# Patient Record
Sex: Female | Born: 2000 | Race: Black or African American | Hispanic: No | Marital: Single | Smoking: Never smoker
Health system: Southern US, Community
[De-identification: ages and names within clinical notes are randomized; demographics above are authoritative.]

## PROBLEM LIST (undated history)

## (undated) DIAGNOSIS — N3010 Interstitial cystitis (chronic) without hematuria: Secondary | ICD-10-CM

## (undated) DIAGNOSIS — R011 Cardiac murmur, unspecified: Secondary | ICD-10-CM

## (undated) DIAGNOSIS — D649 Anemia, unspecified: Secondary | ICD-10-CM

## (undated) DIAGNOSIS — N739 Female pelvic inflammatory disease, unspecified: Secondary | ICD-10-CM

## (undated) DIAGNOSIS — M797 Fibromyalgia: Secondary | ICD-10-CM

## (undated) HISTORY — DX: Fibromyalgia: M79.7

## (undated) HISTORY — DX: Anemia, unspecified: D64.9

## (undated) HISTORY — DX: Interstitial cystitis (chronic) without hematuria: N30.10

---
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
## 2022-01-17 ENCOUNTER — Other Ambulatory Visit: Payer: Self-pay

## 2022-01-19 ENCOUNTER — Ambulatory Visit (INDEPENDENT_AMBULATORY_CARE_PROVIDER_SITE_OTHER): Payer: Medicaid Other | Admitting: Nurse Practitioner

## 2022-01-19 ENCOUNTER — Other Ambulatory Visit (INDEPENDENT_AMBULATORY_CARE_PROVIDER_SITE_OTHER): Payer: Medicaid Other

## 2022-01-19 ENCOUNTER — Encounter: Payer: Self-pay | Admitting: Nurse Practitioner

## 2022-01-19 VITALS — BP 100/60 | HR 95 | Ht 67.0 in | Wt 228.0 lb

## 2022-01-19 DIAGNOSIS — K5900 Constipation, unspecified: Secondary | ICD-10-CM

## 2022-01-19 DIAGNOSIS — R1032 Left lower quadrant pain: Secondary | ICD-10-CM

## 2022-01-19 LAB — HIGH SENSITIVITY CRP: CRP, High Sensitivity: 6.14 mg/L — ABNORMAL HIGH (ref 0.000–5.000)

## 2022-01-19 LAB — CBC WITH DIFFERENTIAL/PLATELET
Basophils Absolute: 0 10*3/uL (ref 0.0–0.1)
Basophils Relative: 0.5 % (ref 0.0–3.0)
Eosinophils Absolute: 0.2 10*3/uL (ref 0.0–0.7)
Eosinophils Relative: 3 % (ref 0.0–5.0)
HCT: 38.3 % (ref 36.0–49.0)
Hemoglobin: 12.7 g/dL (ref 12.0–16.0)
Lymphocytes Relative: 35.6 % (ref 24.0–48.0)
Lymphs Abs: 2.1 10*3/uL (ref 0.7–4.0)
MCHC: 33 g/dL (ref 31.0–37.0)
MCV: 88.2 fl (ref 78.0–98.0)
Monocytes Absolute: 0.6 10*3/uL (ref 0.1–1.0)
Monocytes Relative: 9.4 % (ref 3.0–12.0)
Neutro Abs: 3 10*3/uL (ref 1.4–7.7)
Neutrophils Relative %: 51.5 % (ref 43.0–71.0)
Platelets: 280 10*3/uL (ref 150.0–575.0)
RBC: 4.35 Mil/uL (ref 3.80–5.70)
RDW: 13.9 % (ref 11.4–15.5)
WBC: 5.9 10*3/uL (ref 4.5–13.5)

## 2022-01-19 LAB — COMPREHENSIVE METABOLIC PANEL
ALT: 9 U/L (ref 0–35)
AST: 15 U/L (ref 0–37)
Albumin: 4.5 g/dL (ref 3.5–5.2)
Alkaline Phosphatase: 58 U/L (ref 47–119)
BUN: 11 mg/dL (ref 6–23)
CO2: 27 mEq/L (ref 19–32)
Calcium: 9.4 mg/dL (ref 8.4–10.5)
Chloride: 102 mEq/L (ref 96–112)
Creatinine, Ser: 0.8 mg/dL (ref 0.40–1.20)
GFR: 107.4 mL/min (ref >60.00)
Glucose, Bld: 75 mg/dL (ref 70–99)
Potassium: 4.1 mEq/L (ref 3.5–5.1)
Sodium: 139 mEq/L (ref 135–145)
Total Bilirubin: 0.3 mg/dL (ref 0.3–1.2)
Total Protein: 7.7 g/dL (ref 6.0–8.3)

## 2022-01-19 LAB — SEDIMENTATION RATE: Sed Rate: 38 mm/hr — ABNORMAL HIGH (ref 0–20)

## 2022-01-19 MED ORDER — DICYCLOMINE HCL 10 MG PO CAPS: 10.0000 mg | ORAL_CAPSULE | Freq: Three times a day (TID) | ORAL | 0 refills | Status: DC | PRN

## 2022-01-19 NOTE — Progress Notes (Signed)
? ? ? ?01/19/2022 ?Jochebed Bills ?[id number] ?[birth year] ? ? ?CHIEF COMPLAINT: Stomach pain  ? ?HISTORY OF PRESENT ILLNESS: Norma Wright is an 21 year old female with a past medical history of acne and chronic LLQ pain.  No past surgical history.  She presents to our office today for further evaluation regarding "stomach pain". She points to her LLQ when referring to having "stomach pain". No epigastric or LUQ pain. She endorses having a history of LLQ pain which started at the age of 54, went away when she was 55 then recurred 10/2021.  She went to the hospital ED x 2 in 2013 due to her LLQ pain, etiology for her abdominal pain was not identified.  She complains of having LLQ pain which comes and goes but occurs most days.  She passes a normal formed brown bowel movement twice weekly.  No fiber or laxative use.  She passed a small amount of bright red blood with her bowel movements for 1 week 10/2021 which resolved until 12/2021 when she saw a small amount of blood on her stools x 2.  No associated anorectal pain.  No mucus per the rectum.  She is lactose intolerant.  Sugary foods gives her the sensation of feeling hot and triggers her LLQ pain.  No known family history of IBD or colorectal cancer.  Her weight fluctuates up and down a bit, no significant weight loss.  She started taking Accutane for acne 08/2021.  She infrequently takes Advil.  She complains of having chronic joint pain specifically to her hands, elbows and knees. ? ?She presented to the ED 12/11/2021 with chest and abdominal pain. She also noted having a sore throat, headaches, body aches and nausea for few days.  EKG showed sinus tachycardia.  X-ray is negative.  WBC 11.2.  Hemoglobin 12.3.  BMP was normal.  She was thought to likely have a viral illness and she was discharged home. ? ? ?  Latest Ref Rng & Units 12/11/2021  ?  6:51 PM 09/25/2018  ?  3:04 PM 08/23/2017  ? 10:39 AM  ?CBC  ?WBC 4.0 - 10.5 K/uL 11.2    6.5    ?Hemoglobin 12.0 - 15.0 g/dL 37.8    58.8   50.2    ?Hematocrit 36.0 - 46.0 % 38.3    39.6    ?Platelets 150 - 400 K/uL 309    288    ?  ? ? ? ?  Latest Ref Rng & Units 12/11/2021  ?  6:51 PM 08/23/2017  ? 10:39 AM  ?CMP  ?Glucose 70 - 99 mg/dL 95   89    ?BUN 6 - 20 mg/dL 8   14    ?Creatinine 0.44 - 1.00 mg/dL 7.74   1.28    ?Sodium 135 - 145 mmol/L 137   139    ?Potassium 3.5 - 5.1 mmol/L 3.9   4.3    ?Chloride 98 - 111 mmol/L 102   103    ?CO2 22 - 32 mmol/L 26   29    ?Calcium 8.9 - 10.3 mg/dL 9.7   78.6    ?Total Protein 6.3 - 8.2 g/dL  7.4    ?Total Bilirubin 0.2 - 1.1 mg/dL  0.3    ?AST 12 - 32 U/L  13    ?ALT 6 - 19 U/L  7    ? ?Social History: She is single.  She works in Engineering geologist.  Non-smoker.  No alcohol use.  No drug use. ? ?  Family History:  ? ? reports that she has never smoked. She has never used smokeless tobacco. She reports that she does not drink alcohol and does not use drugs. ?family history includes Asthma in her maternal uncle; Kidney disease in her maternal grandfather. ? ?No Known Allergies ? ?  ?Outpatient Encounter Medications as of 01/19/2022  ?Medication Sig  ? Multiple Vitamin (MULTIVITAMIN) tablet Take 1 tablet by mouth daily.  ? Probiotic Product (PROBIOTIC-10 PO) Take by mouth.  ? ?No facility-administered encounter medications on file as of 01/19/2022.  ? ? ?REVIEW OF SYSTEMS:  ?Gen: Denies fever, sweats or chills. No weight loss.  ?CV: Denies chest pain, palpitations or edema. ?Resp: Denies cough, shortness of breath of hemoptysis.  ?GI: See HPI.   ?GU : Denies urinary burning, blood in urine, increased urinary frequency or incontinence. ?MS: Denies joint pain, muscles aches or weakness. ?Derm: Denies rash, itchiness, skin lesions or unhealing ulcers. ?Psych: Denies depression, anxiety or memory loss. ?Heme: Denies bruising, easy bleeding. ?Neuro:  Denies headaches, dizziness or paresthesias. ?Endo:  Denies any problems with DM, thyroid or adrenal function. ? ?PHYSICAL EXAM: ?Ht 5\' 7"  (1.702 m)   Wt 228 lb (103.4 kg)    BMI 35.71 kg/m?  ? ?General: 21 year old female in no acute distress. ?Head: Normocephalic and atraumatic. ?Eyes:  Sclerae non-icteric, conjunctive pink. ?Ears: Normal auditory acuity. ?Mouth: Dentition intact. No ulcers or lesions.  ?Neck: Supple, no lymphadenopathy or thyromegaly.  ?Lungs: Clear bilaterally to auscultation without wheezes, crackles or rhonchi. ?Heart: Regular rate and rhythm. No murmur, rub or gallop appreciated.  ?Abdomen: Soft, non distended.  Very mild LLQ tenderness without rebound or guarding.  No masses. No hepatosplenomegaly. Normoactive bowel sounds x 4 quadrants.  ?Rectal: Deferred. ?Musculoskeletal: Symmetrical with no gross deformities. ?Skin: Warm and dry. No rash or lesions on visible extremities. ?Extremities: No edema. ?Neurological: Alert oriented x 4, no focal deficits.  ?Psychological:  Alert and cooperative. Normal mood and affect. ? ?ASSESSMENT AND PLAN: ? ?71) 21 year old female with recurrent chronic LLQ pain with associated bloody stools x 7 days 10/2021 and x 2 days 12/2021 ?-CBC, CMP, CRP, TTG and IgA ?-MiraLAX nightly to increase stool output ?-I added Benefiber 1 tablespoon daily in 1 week as tolerated ?-Patient will follow-up in the office in 4 to 6 weeks, if her LLQ pain persists to further discuss scheduling a CTAP and diagnostic colonoscopy ?-Patient to contact office if bloody stools recur ? ?2) Chronic constipation, passes a BM twice weekly ?-See plan in # 1 ? ? ? ?CC:  No ref. provider found ? ? ? ?

## 2022-01-19 NOTE — Patient Instructions (Addendum)
Your provider has requested that you go to the basement level for lab work before leaving today. Press "B" on the elevator. The lab is located at the first door on the left as you exit the elevator. ? ?Please purchase the following medications over the counter and take as directed: ?Miralax 17 grams (1 capful) dissolved in at least 8 ounces water/juice  as tolerated to increase stool output. ? ?Benefiber 1 tablespoon once daily dissolved in at least 8 ounces water/juice daily (please start this 1 week prior to starting Miralax). ? ?We have sent the following medications to your pharmacy for you to pick up at your convenience: ?Dicyclomine 10 mg three times daily as needed for abdominal pain ? ?Please follow up with Alcide Evener, NP on 03/08/22 at 10:00 am. ? ?If you are age 82 or older, your body mass index should be between 23-30. Your Body mass index is 35.71 kg/m?Marland Kitchen If this is out of the aforementioned range listed, please consider follow up with your Primary Care Provider. ? ?If you are age 35 or younger, your body mass index should be between 19-25. Your Body mass index is 35.71 kg/m?Marland Kitchen If this is out of the aformentioned range listed, please consider follow up with your Primary Care Provider.  ? ?________________________________________________________ ? ?The [facility] providers would like to encourage you to use [facility] to communicate with providers for non-urgent requests or questions.  Due to long hold times on the telephone, sending your provider a message by [facility] may be a faster and more efficient way to get a response.  Please allow 48 business hours for a response.  Please remember that this is for non-urgent requests.  ?_______________________________________________________ ? ?Due to recent changes in healthcare laws, you may see the results of your imaging and laboratory studies on MyChart before your provider has had a chance to review them.  We understand that in some cases there may be  results that are confusing or concerning to you. Not all laboratory results come back in the same time frame and the provider may be waiting for multiple results in order to interpret others.  Please give [place] 48 hours in order for your provider to thoroughly review all the results before contacting the office for clarification of your results.  ? ?

## 2022-01-22 ENCOUNTER — Telehealth: Payer: Self-pay | Admitting: Nurse Practitioner

## 2022-01-22 LAB — TISSUE TRANSGLUTAMINASE, IGA: (tTG) Ab, IgA: <1 U/mL

## 2022-01-22 LAB — IGA: Immunoglobulin A: 157 mg/dL (ref 47–310)

## 2022-01-22 NOTE — Telephone Encounter (Signed)
Inbound call from patient requesting a call back to go over lab results from 5/5. Please advise.  ?

## 2022-01-23 NOTE — Telephone Encounter (Signed)
See lab notes

## 2022-02-08 NOTE — Progress Notes (Signed)
Reviewed and agree with documentation and assessment and plan. K. Veena Laylonie Marzec , MD   

## 2022-02-19 ENCOUNTER — Other Ambulatory Visit: Payer: Self-pay | Admitting: Nurse Practitioner

## 2022-03-07 NOTE — Progress Notes (Signed)
03/08/2022 Norma Wright [id number] [birth year]   Chief Complaint: Constipation, abdominal pain follow-up  History of Present Illness:  Norma Wright is an 21 year old female with a past medical history of acne and chronic LLQ pain.  No past surgical history. I initially saw Norma Wright in office on 01/19/2022 for further evaluation regarding LLQ pain and constipation.  At that time, I recommended for her to take Benefiber in the morning and MiraLAX nightly.  She stated within 1 week of taking Benefiber she started passing normal formed brown bowel movement daily and her LLQ pain abated.  She took Miralax x 1 dose. She stopped taking Benefiber as her symptoms abated which has resulted in passing a BM every other day. No further rectal bleeding.  Labs 01/19/2022 showed a hemoglobin level of 12.7.  Hematocrit 38.3.  WBC 5.9.  CMP was normal.  TTG and IgA levels were within normal limits.  Sed rate was elevated at 38 and CRP 6.140.  She has increased the fiber in her diet.  She complains of bilateral elbow and knee pain which started 3 to 4 months ago.  She reported taking Ferrous sulfate and B12 supplement for many years she stopped taking of 3 to 4 months ago.  She has menorrhagia, reports heavy menstrual bleeding 3 to 4 days monthly.  Current Outpatient Medications on File Prior to Visit  Medication Sig Dispense Refill   cyanocobalamin 1000 MCG tablet Take 1 tablet by mouth daily.     dicyclomine (BENTYL) 10 MG capsule TAKE 1 CAPSULE (10 MG TOTAL) BY MOUTH 3 TIMES A DAY AS NEEDED FOR ABDOMINAL PAIN 90 capsule 0   ferrous sulfate 325 (65 FE) MG EC tablet Take by mouth.     Probiotic Product (PROBIOTIC-10 PO) Take by mouth.     No current facility-administered medications on file prior to visit.   No Known Allergies  Current Medications, Allergies, Past Medical History, Past Surgical History, Family History and Social History were reviewed in [employer] record.  Review  of Systems:   Constitutional: Negative for fever, sweats, chills or weight loss.  Respiratory: Negative for shortness of breath.   Cardiovascular: Negative for chest pain, palpitations and leg swelling.  Gastrointestinal: See HPI.  Musculoskeletal:+ Knee and elbow pain.  Neurological: Negative for dizziness, headaches or paresthesias.   Physical Exam: BP 118/80   Pulse 79   Ht 5\' 7"  (1.702 m)   Wt 222 lb (100.7 kg)   BMI 34.77 kg/m   General: 21 year old female in no acute distress Head: Normocephalic and atraumatic. Eyes: No scleral icterus. Conjunctiva pink . Ears: Normal auditory acuity. Mouth: Dentition intact. No ulcers or lesions.  Lungs: Clear throughout to auscultation. Heart: Regular rate and rhythm, no murmur. Abdomen: Soft, nontender and nondistended. No masses or hepatomegaly. Normal bowel sounds x 4 quadrants.  Rectal: Deferred Musculoskeletal: Symmetrical with no gross deformities. Extremities: No edema. Neurological: Alert oriented x 4. No focal deficits.  Psychological: Alert and cooperative. Normal mood and affect  Assessment and Recommendations:  12) 21 year old female with LLQ pain, resolved one week after taking Benefiber daily. No further rectal bleeding. Elevated CRP and sed rate levels.  Normal TTG and IgA levels. -Benefiber 1 tablespoon daily -Drink 6 to 8 glasses of water daily -MiraLAX nightly as needed -Patient will contact her office if constipation, lower abdominal pain or rectal bleeding recurs.  I discussed scheduling a diagnostic colonoscopy ro rule out IBD/colitis if her symptoms recur -Follow up  as needed    2) Chronic constipation, resolved after taking Benefiber x 1 week and Miralax x 1 dose  -See plan in # 1  3) Bilateral elbow and knee pain. Elevated CRP and sed rate.  -Rheumatology referral   4) History of menorrhagia  -Patient advised to obtain a PCP, follow up with gyn

## 2022-03-08 ENCOUNTER — Encounter: Payer: Self-pay | Admitting: Nurse Practitioner

## 2022-03-08 ENCOUNTER — Ambulatory Visit (INDEPENDENT_AMBULATORY_CARE_PROVIDER_SITE_OTHER): Payer: Medicaid Other | Admitting: Nurse Practitioner

## 2022-03-08 VITALS — BP 118/80 | HR 79 | Ht 67.0 in | Wt 222.0 lb

## 2022-03-08 DIAGNOSIS — R1032 Left lower quadrant pain: Secondary | ICD-10-CM

## 2022-03-08 DIAGNOSIS — K5900 Constipation, unspecified: Secondary | ICD-10-CM | POA: Diagnosis not present

## 2022-03-08 DIAGNOSIS — M2550 Pain in unspecified joint: Secondary | ICD-10-CM

## 2022-03-08 DIAGNOSIS — K581 Irritable bowel syndrome with constipation: Secondary | ICD-10-CM | POA: Insufficient documentation

## 2022-03-08 NOTE — Patient Instructions (Addendum)
1) TAKE BENEFIBER ONE TABLESPOON DAILY   2) TAKE MIRALAX AT BED TIME AS NEEDED, OK TO TAKE EVERY NIGHT AS NEEDED  3) CONTACT OUR OFFICE IF YOUR ABDOMINAL PAIN, CONSTIPATION OR RECTAL BLEEDING RECURS   We have referred you to Dr Corliss Skains for Rheumatology. Please let us know if you do not hear from her office within the next 2-3 weeks.  Please purchase the following medications over the counter and take as directed: Benefiber Miralax  Follow up as needed.  If you are age 68 or older, your body mass index should be between 23-30. Your Body mass index is 35.71 kg/m. If this is out of the aforementioned range listed, please consider follow up with your Primary Care Provider.  If you are age 24 or younger, your body mass index should be between 19-25. Your Body mass index is 35.71 kg/m. If this is out of the aformentioned range listed, please consider follow up with your Primary Care Provider.   ________________________________________________________  The [facility] providers would like to encourage you to use [facility] to communicate with providers for non-urgent requests or questions.  Due to long hold times on the telephone, sending your provider a message by [facility] may be a faster and more efficient way to get a response.  Please allow 48 business hours for a response.  Please remember that this is for non-urgent requests.  _______________________________________________________  Due to recent changes in healthcare laws, you may see the results of your imaging and laboratory studies on MyChart before your provider has had a chance to review them.  We understand that in some cases there may be results that are confusing or concerning to you. Not all laboratory results come back in the same time frame and the provider may be waiting for multiple results in order to interpret others.  Please give [place] 48 hours in order for your provider to thoroughly review all the results before contacting the  office for clarification of your results.

## 2022-04-24 ENCOUNTER — Ambulatory Visit
Admission: EM | Admit: 2022-04-24 | Discharge: 2022-04-24 | Disposition: A | Discharge status: Home / Self Care | Payer: Medicaid Other | Attending: Family Medicine | Admitting: Family Medicine

## 2022-04-24 DIAGNOSIS — M2550 Pain in unspecified joint: Secondary | ICD-10-CM

## 2022-04-24 MED ORDER — MELOXICAM 15 MG PO TABS: 15.0000 mg | ORAL_TABLET | Freq: Every day | ORAL | 0 refills | Status: DC

## 2022-04-24 MED ORDER — KETOROLAC TROMETHAMINE 30 MG/ML IJ SOLN
30.0000 mg | Freq: Once | INTRAMUSCULAR | Status: AC
Start: 2022-04-24 — End: 2022-04-24
  Administered 2022-04-24: 30 mg via INTRAMUSCULAR

## 2022-04-24 NOTE — ED Triage Notes (Signed)
The patient states she has been having bone pain and her PCP recommended she go see a rheumatologist but they are booked up. The patient states the pain is worse in her left arm at this time.

## 2022-04-24 NOTE — ED Provider Notes (Signed)
[facility]    CSN: [id number] Arrival date & time: 04/24/22  1734      History   Chief Complaint Chief Complaint  Patient presents with   Arm Pain    HPI Norma Wright is a 21 y.o. female.    Arm Pain   Here for bone pain and joint pain, going on for years.  He states her gastroenterologist did a CRP and sed rate that were elevated.  He told her she should probably see a rheumatologist, and she is got an appointment for late September.  She states she is in a lot of pain and so comes here today for assistance.  She does not have a PCP currently.  Ibuprofen is not helping  No recent fevers.  Her main pain lately is in her left arm and hand and her right hand.  Also she has some pain in her left leg.  No edema Last menstrual cycle was in mid July.  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 No pertinent surgical history.  OB History   No obstetric history on file.      Home Medications    Prior to Admission medications   Medication Sig Start Date End Date Taking? Authorizing Provider  meloxicam (MOBIC) 15 MG tablet Take 1 tablet (15 mg total) by mouth daily. 04/24/22  Yes Zenia Resides, MD  cyanocobalamin 1000 MCG tablet Take 1 tablet by mouth daily.    [provider]  dicyclomine (BENTYL) 10 MG capsule TAKE 1 CAPSULE (10 MG TOTAL) BY MOUTH 3 TIMES A DAY AS NEEDED FOR ABDOMINAL PAIN 02/20/22   Arnaldo Natal, NP  ferrous sulfate 325 (65 FE) MG EC tablet Take by mouth.    [provider]  Probiotic Product (PROBIOTIC-10 PO) Take by mouth.    [provider]    Family History Family History  Problem Relation Age of Onset   Asthma Maternal Uncle    Kidney disease Maternal Grandfather     Social  History Social History   Tobacco Use   Smoking status: Never   Smokeless tobacco: Never  Vaping Use   Vaping Use: Never used  Substance Use Topics   Alcohol use: No   Drug use: No     Allergies   Patient has no known allergies.   Review of Systems Review of Systems   Physical Exam Triage Vital Signs ED Triage Vitals  Enc Vitals Group     BP 04/24/22 1910 113/80     Pulse Rate 04/24/22 1910 81     Resp 04/24/22 1910 18     Temp 04/24/22 1910 98.5 F (36.9 C)     Temp Source 04/24/22 1910 Oral     SpO2 04/24/22 1910 97 %     Weight --      Height --      Head Circumference --      Peak Flow --      Pain Score 04/24/22 1911 6     Pain Loc --      Pain Edu? --      Excl. in GC? --    No data found.  Updated Vital Signs BP 113/80 (BP Location: Right Arm)   Pulse 81  Temp 98.5 F (36.9 C) (Oral)   Resp 18   LMP 03/30/2022   SpO2 97%   Visual Acuity Right Eye Distance:   Left Eye Distance:   Bilateral Distance:    Right Eye Near:   Left Eye Near:    Bilateral Near:     Physical Exam Vitals reviewed.  Constitutional:      General: She is not in acute distress.    Appearance: She is not toxic-appearing.  HENT:     Left Ear: Ear canal normal.     Mouth/Throat:     Mouth: Mucous membranes are moist.  Eyes:     Extraocular Movements: Extraocular movements intact.     Conjunctiva/sclera: Conjunctivae normal.     Pupils: Pupils are equal, round, and reactive to light.  Cardiovascular:     Rate and Rhythm: Normal rate and regular rhythm.     Heart sounds: No murmur heard. Pulmonary:     Effort: Pulmonary effort is normal.     Breath sounds: Normal breath sounds.  Musculoskeletal:     Cervical back: Neck supple.     Right lower leg: No edema.     Left lower leg: No edema.  Lymphadenopathy:     Cervical: No cervical adenopathy.  Skin:    Capillary Refill: Capillary refill takes less than 2 seconds.     Coloration: Skin is not jaundiced or pale.      Findings: No rash.  Neurological:     General: No focal deficit present.     Mental Status: She is alert and oriented to person, place, and time.  Psychiatric:        Behavior: Behavior normal.      UC Treatments / Results  Labs (all labs ordered are listed, but only abnormal results are displayed) Labs Reviewed  CBC WITH DIFFERENTIAL/PLATELET  COMPREHENSIVE METABOLIC PANEL  HIGH SENSITIVITY CRP  SEDIMENTATION RATE  RHEUMATOID FACTOR    EKG   Radiology No results found.  Procedures Procedures (including critical care time)  Medications Ordered in UC Medications  ketorolac (TORADOL) 30 MG/ML injection 30 mg (has no administration in time range)    Initial Impression / Assessment and Plan / UC Course  I have reviewed the triage vital signs and the nursing notes.  Pertinent labs & imaging results that were available during my care of the patient were reviewed by me and considered in my medical decision making (see chart for details).    Action of Toradol is given tonight and I have sent a prescription of meloxicam for her to try.  I discussed with her that an appointment about 6 weeks away is actually pretty good for getting in with a rheumatologist.  We are going to repeat her inflammatory markers, and also do a rheumatoid factor and a CBC.  She can self schedule a PCP appointment on the [facility] health website Final Clinical Impressions(s) / UC Diagnoses   Final diagnoses:  Arthralgia, unspecified joint     Discharge Instructions      You have been given a shot of Toradol 30 mg today.  Take meloxicam 15 mg- 1 daily.-This is an anti-inflammatory and for pain.  Do not take ibuprofen or naproxen with it  We have drawn blood work and will let you know if anything is significantly abnormal  You can use the QR code/website at the back of the summary paperwork to schedule yourself a new patient appointment with primary care  ED Prescriptions      Medication Sig Dispense Auth. Provider   meloxicam (MOBIC) 15 MG tablet Take 1 tablet (15 mg total) by mouth daily. 30 tablet Beverley Sherrard, Janace Aris, MD      PDMP not reviewed this encounter.   Zenia Resides, MD 04/24/22 [phone]

## 2022-04-24 NOTE — Discharge Instructions (Addendum)
You have been given a shot of Toradol 30 mg today.  Take meloxicam 15 mg- 1 daily.-This is an anti-inflammatory and for pain.  Do not take ibuprofen or naproxen with it  We have drawn blood work and will let you know if anything is significantly abnormal  You can use the QR code/website at the back of the summary paperwork to schedule yourself a new patient appointment with primary care

## 2022-04-26 LAB — COMPREHENSIVE METABOLIC PANEL WITH GFR
ALT: 22 IU/L (ref 0–32)
AST: 15 IU/L (ref 0–40)
Albumin/Globulin Ratio: 1.8 (ref 1.2–2.2)
Albumin: 4.7 g/dL (ref 4.0–5.0)
Alkaline Phosphatase: 72 IU/L (ref 42–106)
BUN/Creatinine Ratio: 14 (ref 9–23)
BUN: 10 mg/dL (ref 6–20)
Bilirubin Total: 0.2 mg/dL (ref 0.0–1.2)
CO2: 22 mmol/L (ref 20–29)
Calcium: 9.4 mg/dL (ref 8.7–10.2)
Chloride: 102 mmol/L (ref 96–106)
Creatinine, Ser: 0.69 mg/dL (ref 0.57–1.00)
Globulin, Total: 2.6 g/dL (ref 1.5–4.5)
Glucose: 86 mg/dL (ref 70–99)
Potassium: 4.1 mmol/L (ref 3.5–5.2)
Sodium: 137 mmol/L (ref 134–144)
Total Protein: 7.3 g/dL (ref 6.0–8.5)
eGFR: 129 mL/min/1.73

## 2022-04-26 LAB — HIGH SENSITIVITY CRP: CRP, High Sensitivity: 7.43 mg/L — ABNORMAL HIGH (ref 0.00–3.00)

## 2022-04-26 LAB — CBC WITH DIFFERENTIAL/PLATELET
Basophils Absolute: 0 x10E3/uL (ref 0.0–0.2)
Basos: 1 %
EOS (ABSOLUTE): 0.1 x10E3/uL (ref 0.0–0.4)
Eos: 2 %
Hematocrit: 36.3 % (ref 34.0–46.6)
Hemoglobin: 12.4 g/dL (ref 11.1–15.9)
Immature Grans (Abs): 0 x10E3/uL (ref 0.0–0.1)
Immature Granulocytes: 1 %
Lymphocytes Absolute: 1.9 x10E3/uL (ref 0.7–3.1)
Lymphs: 33 %
MCH: 29.2 pg (ref 26.6–33.0)
MCHC: 34.2 g/dL (ref 31.5–35.7)
MCV: 86 fL (ref 79–97)
Monocytes Absolute: 0.5 x10E3/uL (ref 0.1–0.9)
Monocytes: 9 %
Neutrophils Absolute: 3.2 x10E3/uL (ref 1.4–7.0)
Neutrophils: 54 %
Platelets: 293 x10E3/uL (ref 150–450)
RBC: 4.24 x10E6/uL (ref 3.77–5.28)
RDW: 12.8 % (ref 11.7–15.4)
WBC: 5.8 x10E3/uL (ref 3.4–10.8)

## 2022-04-26 LAB — RHEUMATOID FACTOR: Rheumatoid fact SerPl-aCnc: 11 IU/mL (ref <14.0)

## 2022-04-26 LAB — SEDIMENTATION RATE: Sed Rate: 42 mm/hr — ABNORMAL HIGH (ref 0–32)

## 2022-06-04 NOTE — Progress Notes (Unsigned)
Office Visit Note  Patient: Norma Wright             Date of Birth: [birth year]           MRN: [id number]             PCP: Pcp, No Referring: Carl Best * Visit Date: 06/05/2022 Occupation: Retail/currently out  Subjective:   History of Present Illness: Norma Wright is a 21 y.o. female here for evaluation of joint pain including knees and elbows with elevated inflammatory markers. She saw GI due to persistent abdominal pain, constipation, and blood in stools. She saw urgent care for these pains last month treated with meloxicam and toradol injection.***   Symptoms increased in past few months Gradual onset Bilateral hand pain worst at thumb and PIPs Takes an hour to get mbility in mornings Sees sweling throughout her hand with redness and veins stand out sometimes Tordal shot helped Meloxicam not noticebale difference ***  Labs reviewed 04/2022 RF neg ESR 42 hsCRP 7.43 CBC wnl CMP wnl  01/2022 ESR 38 hsCRP 6.14 tTG neg IgA 157  Activities of Daily Living:  Patient reports morning stiffness for 15 minutes.   Patient Reports nocturnal pain.  Difficulty dressing/grooming: Reports Difficulty climbing stairs: Reports Difficulty getting out of chair: Reports Difficulty using hands for taps, buttons, cutlery, and/or writing: Reports  Review of Systems  Constitutional:  Positive for fatigue.  HENT:  Negative for mouth sores and mouth dryness.   Eyes:  Negative for dryness.  Respiratory:  Positive for shortness of breath.   Cardiovascular:  Positive for chest pain and palpitations.  Gastrointestinal:  Negative for blood in stool, constipation and diarrhea.  Endocrine: Negative for increased urination.  Genitourinary:  Negative for involuntary urination.  Musculoskeletal:  Positive for joint pain, joint pain, myalgias, morning stiffness, muscle tenderness and myalgias. Negative for gait problem, joint swelling and muscle weakness.  Skin:  Negative for color  change, rash, hair loss and sensitivity to sunlight.  Allergic/Immunologic: Negative for susceptible to infections.  Neurological:  Positive for dizziness and headaches.  Hematological:  Positive for bruising/bleeding tendency. Negative for swollen glands.  Psychiatric/Behavioral:  Positive for sleep disturbance. Negative for depressed mood. The patient is nervous/anxious.     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 No pertinent surgical history. Social History   Social History Narrative   Not on file    There is no immunization history on file for this patient.   Objective: Vital Signs: BP 132/85 (BP Location: Right Arm, Patient Position: Sitting, Cuff Size: Normal)   Pulse 78   Resp 15   Ht '5\' 7"'  (1.702 m)   Wt 237 lb 3.2 oz (107.6 kg)   BMI 37.15 kg/m    Physical Exam Constitutional:      Appearance: She is obese.  HENT:     Nose: Nose normal.     Mouth/Throat:     Mouth: Mucous membranes are moist.     Pharynx: Oropharynx is  clear.  Cardiovascular:     Rate and Rhythm: Normal rate and regular rhythm.  Pulmonary:     Effort: Pulmonary effort is normal.     Breath sounds: Normal breath sounds.  Musculoskeletal:     Right lower leg: No edema.     Left lower leg: No edema.  Lymphadenopathy:     Cervical: No cervical adenopathy.  Skin:    General: Skin is warm and dry.     Findings: No rash.  Neurological:     General: No focal deficit present.     Mental Status: She is alert.  Psychiatric:        Mood and  Affect: Mood normal.      Musculoskeletal Exam:  Shoulders full ROM no tenderness or swelling Elbows full ROM no tenderness or swelling Wrists full ROM no tenderness or swelling Fingers full ROM no swelling, left hand 1st MCP and PIPs tenderness to pressure Knees full ROM no tenderness or swelling Ankles full ROM no tenderness or swelling  Investigation: No additional findings.  Imaging: No results found.  Recent Labs: Lab Results  Component Value Date   WBC 5.8 04/24/2022   HGB 12.4 04/24/2022   PLT 293 04/24/2022   NA 137 04/24/2022   K 4.1 04/24/2022   CL 102 04/24/2022   CO2 22 04/24/2022   GLUCOSE 86 04/24/2022   BUN 10 04/24/2022   CREATININE 0.69 04/24/2022   BILITOT 0.2 04/24/2022   ALKPHOS 72 04/24/2022   AST 15 04/24/2022   ALT 22 04/24/2022   PROT 7.3 04/24/2022   ALBUMIN 4.7 04/24/2022   CALCIUM 9.4 04/24/2022    Speciality Comments: No specialty comments available.  Procedures:  No procedures performed Allergies: Patient has no known allergies.   Assessment / Plan:     Visit Diagnoses: Sedimentation rate elevation - Plan: Cyclic citrul peptide antibody, IgG, ANA, Sedimentation rate, C-reactive protein  Polyarthralgia - Plan: XR Hand 2 View Right, XR Hand 2 View Left  Orders: Orders Placed This Encounter  Procedures   XR Hand 2 View Right   XR Hand 2 View Left   Cyclic citrul peptide antibody, IgG   ANA   Sedimentation rate   C-reactive protein   No orders of the defined types were placed in this encounter.   Face-to-face time spent with patient was *** minutes. Greater than 50% of time was spent in counseling and coordination of care.  Follow-Up Instructions: Return in about 2 weeks (around 06/19/2022) for New pt +ESR f/u 2-3wks.   Collier Salina, MD  Note - This record has been created using [employer].  Chart creation errors have been sought, but may not always  have been located. Such creation errors do not reflect on   the standard of medical care.

## 2022-06-05 ENCOUNTER — Ambulatory Visit: Payer: Medicaid Other

## 2022-06-05 ENCOUNTER — Encounter: Payer: Self-pay | Admitting: Internal Medicine

## 2022-06-05 ENCOUNTER — Ambulatory Visit (INDEPENDENT_AMBULATORY_CARE_PROVIDER_SITE_OTHER): Payer: Medicaid Other

## 2022-06-05 ENCOUNTER — Ambulatory Visit: Payer: Medicaid Other | Attending: Internal Medicine | Admitting: Internal Medicine

## 2022-06-05 VITALS — BP 132/85 | HR 78 | Resp 15 | Ht 67.0 in | Wt 237.2 lb

## 2022-06-05 DIAGNOSIS — M79641 Pain in right hand: Secondary | ICD-10-CM | POA: Diagnosis not present

## 2022-06-05 DIAGNOSIS — M2550 Pain in unspecified joint: Secondary | ICD-10-CM

## 2022-06-05 DIAGNOSIS — R700 Elevated erythrocyte sedimentation rate: Secondary | ICD-10-CM | POA: Insufficient documentation

## 2022-06-05 DIAGNOSIS — M79642 Pain in left hand: Secondary | ICD-10-CM | POA: Diagnosis not present

## 2022-06-05 MED ORDER — PREDNISONE 10 MG PO TABS: 10.0000 mg | ORAL_TABLET | Freq: Every day | ORAL | 0 refills | Status: DC

## 2022-06-05 NOTE — Progress Notes (Signed)
Office Visit Note  Patient: Norma Wright             Date of Birth: August 12, 2001           MRN: [id number]             PCP: Pcp, No Referring: No ref. provider found Visit Date: 06/19/2022   Subjective:   History of Present Illness: [name] is a 21 y.o. female here for follow up for evaluation of joint pain including knees and elbows with elevated inflammatory markers.  Since her last visit she tried taking the prednisone 10 mg initially felt this made her very drowsy and so decreased to 5 mg daily.  She noticed some improvement in morning joint stiffness but still had some amount of pain.  She is also noticed starting to increase in weight since her last visit.  Still having episodes of numbness and discoloration sometimes in a foot or in her fingertips lasting for several minutes.  Previous HPI 06/05/2022 [name] is a 21 y.o. female here for evaluation of joint pain including knees and elbows with elevated inflammatory markers.  She does not recall specific timing of onset but symptoms have gradually increased in the past few months.  Worst affected areas bilateral hand pain at the thumb and PIP joints.  She has significant morning stiffness with decreased mobility until about an hour after waking.  She sees episodic swelling throughout the hands often with redness or visible prominence of the veins on backs of her hand.  She saw GI due to persistent abdominal pain, constipation, and blood in stools. She saw urgent care for these pains last month treated with meloxicam and toradol injection.  The Toradol injection did improve her symptoms but she has not noticed any significant difference while taking the meloxicam or with other oral NSAIDs.   Labs reviewed 04/2022 RF neg ESR 42 hsCRP 7.43 CBC wnl CMP wnl   01/2022 ESR 38 hsCRP 6.14 tTG neg IgA 157   Activities of Daily Living:  Patient reports morning stiffness for 15 minutes.   Patient Reports nocturnal pain.   Difficulty dressing/grooming: Reports Difficulty climbing stairs: Reports Difficulty getting out of chair: Reports Difficulty using hands for taps, buttons, cutlery, and/or writing: Reports   Review of Systems  Constitutional:  Positive for fatigue.  HENT:  Negative for mouth sores and mouth dryness.   Eyes:  Negative for dryness.  Respiratory:  Positive for shortness of breath.   Cardiovascular:  Positive for palpitations. Negative for chest pain.  Gastrointestinal:  Negative for blood in stool, constipation and diarrhea.  Endocrine: Negative for increased urination.  Genitourinary:  Negative for involuntary urination.  Musculoskeletal:  Positive for joint pain, joint pain, myalgias, muscle weakness, morning stiffness, muscle tenderness and myalgias. Negative for gait problem and joint swelling.  Skin:  Positive for hair loss and sensitivity to sunlight. Negative for color change and rash.  Allergic/Immunologic: Negative for susceptible to infections.  Neurological:  Positive for dizziness and headaches.  Hematological:  Negative for swollen glands.  Psychiatric/Behavioral:  Positive for sleep disturbance. Negative for depressed mood. The patient is nervous/anxious.     PMFS History:  Patient Active Problem List   Diagnosis Date Noted   Positive ANA (antinuclear antibody) 06/19/2022   Sedimentation rate elevation 06/05/2022   Polyarthralgia 06/05/2022   Constipation 03/08/2022   LLQ pain 03/08/2022   Cystic acne 07/31/2021   Influenza vaccination declined 08/23/2017    Past Medical History:  Diagnosis Date  Heart murmur     Family History  Problem Relation Age of Onset   Heart Problems Mother    Asthma Sister    Asthma Maternal Uncle    Kidney disease Maternal Grandfather    History reviewed. No pertinent surgical history. Social History   Social History Narrative   Not on file    There is no immunization history on file for this patient.   Objective: Vital  Signs: BP 119/77 (BP Location: Left Arm, Patient Position: Sitting, Cuff Size: Normal)   Pulse 81   Resp 15   Ht '5\' 7"'  (1.702 m)   Wt 242 lb 12.8 oz (110.1 kg)   LMP 05/27/2022 (Approximate)   BMI 38.03 kg/m    Physical Exam Constitutional:      Appearance: She is obese.  Cardiovascular:     Rate and Rhythm: Normal rate and regular rhythm.  Pulmonary:     Effort: Pulmonary effort is normal.     Breath sounds: Normal breath sounds.  Musculoskeletal:     Right lower leg: No edema.     Left lower leg: No edema.  Skin:    General: Skin is warm and dry.     Findings: No rash.  Neurological:     Mental Status: She is alert.  Psychiatric:        Mood and Affect: Mood normal.      Musculoskeletal Exam:  Neck full ROM no tenderness Shoulders full ROM no tenderness or swelling Elbows full ROM no tenderness or swelling Wrists full ROM no tenderness or swelling Fingers full ROM no tenderness or swelling No paraspinal tenderness to palpation over upper and lower back Hip normal internal and external rotation without pain, no tenderness to lateral hip palpation Knees full ROM no tenderness or swelling Ankles full ROM no tenderness or swelling MTPs full ROM no tenderness or swelling   Investigation: No additional findings.  Imaging: XR Hand 2 View Left  Result Date: 06/05/2022 Xray left hand 2 views Radiocarpal and carpal joint spaces appear normal.  MCP PIP and DIP joint spaces appear normal.  No erosions or abnormal calcifications seen.  Bone mineralization appears normal. Impression Normal appearing hand x-ray  XR Hand 2 View Right  Result Date: 06/05/2022 Xray right hand 2 views Radiocarpal and carpal joint spaces appear normal.  MCP PIP and DIP joint spaces appear normal.  No erosions or abnormal calcifications seen.  Bone mineralization appears normal. Impression Normal appearing hand x-ray   Recent Labs: Lab Results  Component Value Date   WBC 5.8 04/24/2022   HGB  12.4 04/24/2022   PLT 293 04/24/2022   NA 137 04/24/2022   K 4.1 04/24/2022   CL 102 04/24/2022   CO2 22 04/24/2022   GLUCOSE 86 04/24/2022   BUN 10 04/24/2022   CREATININE 0.69 04/24/2022   BILITOT 0.2 04/24/2022   ALKPHOS 72 04/24/2022   AST 15 04/24/2022   ALT 22 04/24/2022   PROT 7.3 04/24/2022   ALBUMIN 4.7 04/24/2022   CALCIUM 9.4 04/24/2022    Speciality Comments: No specialty comments available.  Procedures:  No procedures performed Allergies: Patient has no known allergies.   Assessment / Plan:     Visit Diagnoses: Positive ANA (antinuclear antibody) - Plan: Anti-scleroderma antibody, RNP Antibody, Anti-Smith antibody, Sjogrens syndrome-A extractable nuclear antibody, Anti-DNA antibody, double-stranded, C3 and C4  Positive ANA at low titer but associated with her reported symptoms we will check more specific antibody markers as detailed above and serum complements.  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  Discussed risks of medication including need for retinal toxicity screening in a year.  Possibly early condition or undifferentiated connective tissue disease.  If she has no benefit would be hesitant to try additional DMARD treatment without more definitive diagnosis.  Polyarthralgia - Plan: hydroxychloroquine (PLAQUENIL) 200 MG tablet  Joint pain and stiffness still in multiple areas a partial improvement with low-dose prednisone.  No obvious inflammation again on exam today but describes morning pattern of stiffness and swelling that is suggestive.   Sedimentation rate elevation  Sedimentation rate was normal on labs at last visit despite no specific medication besides NSAIDs.  CRP was mildly elevated a pretty low positive stress unreliable with obese habitus.  Recommend she can stop taking any of the remaining prednisone at this point.  Orders: Orders Placed This Encounter  Procedures   Anti-scleroderma antibody    RNP Antibody   Anti-Smith antibody   Sjogrens syndrome-A extractable nuclear antibody   Anti-DNA antibody, double-stranded   C3 and C4   Meds ordered this encounter  Medications   hydroxychloroquine (PLAQUENIL) 200 MG tablet    Sig: Take 1 tablet (200 mg total) by mouth daily.    Dispense:  30 tablet    Refill:  2     Follow-Up Instructions: Return in about 2 months (around 08/19/2022) for ?CTD HCQ start f/u 35mo.   CCollier Salina MD  Note - This record has been created using [employer]  Chart creation errors have been sought, but may not always  have been located. Such creation errors do not reflect on  the standard of medical care.

## 2022-06-09 LAB — CYCLIC CITRUL PEPTIDE ANTIBODY, IGG: Cyclic Citrullin Peptide Ab: <16 UNITS

## 2022-06-09 LAB — C-REACTIVE PROTEIN: CRP: 8.3 mg/L — ABNORMAL HIGH (ref <8.0)

## 2022-06-09 LAB — ANTI-NUCLEAR AB-TITER (ANA TITER): ANA Titer 1: 1:80 {titer} — ABNORMAL HIGH

## 2022-06-09 LAB — ANA: Anti Nuclear Antibody (ANA): POSITIVE — AB

## 2022-06-09 LAB — SEDIMENTATION RATE: Sed Rate: 19 mm/h (ref 0–20)

## 2022-06-19 ENCOUNTER — Encounter: Payer: Self-pay | Admitting: Internal Medicine

## 2022-06-19 ENCOUNTER — Ambulatory Visit: Payer: Medicaid Other | Attending: Internal Medicine | Admitting: Internal Medicine

## 2022-06-19 VITALS — BP 119/77 | HR 81 | Resp 15 | Ht 67.0 in | Wt 242.8 lb

## 2022-06-19 DIAGNOSIS — R700 Elevated erythrocyte sedimentation rate: Secondary | ICD-10-CM | POA: Diagnosis not present

## 2022-06-19 DIAGNOSIS — R768 Other specified abnormal immunological findings in serum: Secondary | ICD-10-CM | POA: Diagnosis not present

## 2022-06-19 DIAGNOSIS — M2550 Pain in unspecified joint: Secondary | ICD-10-CM

## 2022-06-19 MED ORDER — HYDROXYCHLOROQUINE SULFATE 200 MG PO TABS: 200.0000 mg | ORAL_TABLET | Freq: Every day | ORAL | 2 refills | Status: DC

## 2022-06-20 LAB — ANTI-DNA ANTIBODY, DOUBLE-STRANDED: ds DNA Ab: <1 IU/mL

## 2022-06-20 LAB — C3 AND C4
C3 Complement: 180 mg/dL (ref 83–193)
C4 Complement: 37 mg/dL (ref 15–57)

## 2022-06-20 LAB — ANTI-SMITH ANTIBODY: ENA SM Ab Ser-aCnc: <1 AI

## 2022-06-20 LAB — ANTI-SCLERODERMA ANTIBODY: Scleroderma (Scl-70) (ENA) Antibody, IgG: <1 AI

## 2022-06-20 LAB — SJOGRENS SYNDROME-A EXTRACTABLE NUCLEAR ANTIBODY: SSA (Ro) (ENA) Antibody, IgG: <1 AI

## 2022-06-20 LAB — RNP ANTIBODY: Ribonucleic Protein(ENA) Antibody, IgG: <1 AI

## 2022-06-27 ENCOUNTER — Encounter: Payer: Self-pay | Admitting: Family Medicine

## 2022-06-27 ENCOUNTER — Ambulatory Visit (INDEPENDENT_AMBULATORY_CARE_PROVIDER_SITE_OTHER): Payer: Medicaid Other | Admitting: Family Medicine

## 2022-06-27 VITALS — HR 107 | Temp 98.6°F | Ht 66.0 in | Wt 241.4 lb

## 2022-06-27 DIAGNOSIS — M2550 Pain in unspecified joint: Secondary | ICD-10-CM | POA: Diagnosis not present

## 2022-06-27 DIAGNOSIS — R233 Spontaneous ecchymoses: Secondary | ICD-10-CM | POA: Insufficient documentation

## 2022-06-27 DIAGNOSIS — R232 Flushing: Secondary | ICD-10-CM | POA: Diagnosis not present

## 2022-06-27 DIAGNOSIS — F411 Generalized anxiety disorder: Secondary | ICD-10-CM | POA: Insufficient documentation

## 2022-06-27 DIAGNOSIS — F419 Anxiety disorder, unspecified: Secondary | ICD-10-CM | POA: Diagnosis not present

## 2022-06-27 LAB — CBC WITH DIFFERENTIAL/PLATELET
Basophils Absolute: 0 10*3/uL (ref 0.0–0.1)
Basophils Relative: 0.5 % (ref 0.0–3.0)
Eosinophils Absolute: 0.1 10*3/uL (ref 0.0–0.7)
Eosinophils Relative: 2.1 % (ref 0.0–5.0)
HCT: 36.1 % (ref 36.0–49.0)
Hemoglobin: 11.9 g/dL — ABNORMAL LOW (ref 12.0–16.0)
Lymphocytes Relative: 26.1 % (ref 24.0–48.0)
Lymphs Abs: 1.7 10*3/uL (ref 0.7–4.0)
MCHC: 33 g/dL (ref 31.0–37.0)
MCV: 87.7 fl (ref 78.0–98.0)
Monocytes Absolute: 0.6 10*3/uL (ref 0.1–1.0)
Monocytes Relative: 9.3 % (ref 3.0–12.0)
Neutro Abs: 4.1 10*3/uL (ref 1.4–7.7)
Neutrophils Relative %: 62 % (ref 43.0–71.0)
Platelets: 299 10*3/uL (ref 150.0–575.0)
RBC: 4.12 Mil/uL (ref 3.80–5.70)
RDW: 13.6 % (ref 11.4–15.5)
WBC: 6.5 10*3/uL (ref 4.5–13.5)

## 2022-06-27 LAB — T4, FREE: Free T4: 0.8 ng/dL (ref 0.60–1.60)

## 2022-06-27 LAB — TSH: TSH: 4.73 u[IU]/mL (ref 0.40–5.00)

## 2022-06-27 NOTE — Patient Instructions (Signed)
It was a pleasure meeting you today.  Please go downstairs for labs before you leave today.  We will be in touch with your results.  Continue close follow-up with your rheumatologist.  I recommend scheduling with a counselor.  I did put in a referral today but you can also call and schedule with someone at your convenience.  You can call to schedule your appointment with a psychiatrist or counselor. A few offices are listed below for you to call.    [facility]  [address] [place], [place], [place] [address]  Phone: [phone]   [facility] [address] [place], [place] [address]  Phone: [phone]

## 2022-06-27 NOTE — Progress Notes (Signed)
New Patient Office Visit  Subjective    Patient ID: Norma Wright, female    DOB: [birth year]  Age: 21 y.o. MRN: [id number]  CC:  Chief Complaint  Patient presents with   New Patient (Initial Visit)    Joint pain, around the arm and leg, see rheumatology for and they cant figure it out    HPI Norma Wright presents to establish care Her mother is with her.   Other providers: Rheumatologist- Dr. Dimple Casey.  GI- Dr. Riley Kill  Recently started on Plaquenil for positive ANA and joint pain.   Concerned about bruising on her leg and veins. States she has burning in her legs at times.  She also reports hot flashes at times.   She and her mother are concerned about anxiety. Reports being anxious as long as she can remember but it has recently worsened. She is not seeing a therapist.   No other concerns today.    Outpatient Encounter Medications as of 06/27/2022  Medication Sig   hydroxychloroquine (PLAQUENIL) 200 MG tablet Take 1 tablet (200 mg total) by mouth daily.   [DISCONTINUED] cyanocobalamin 1000 MCG tablet Take 1 tablet by mouth daily. (Patient not taking: Reported on 06/05/2022)   [DISCONTINUED] dicyclomine (BENTYL) 10 MG capsule TAKE 1 CAPSULE (10 MG TOTAL) BY MOUTH 3 TIMES A DAY AS NEEDED FOR ABDOMINAL PAIN (Patient not taking: Reported on 06/19/2022)   [DISCONTINUED] ferrous sulfate 325 (65 FE) MG EC tablet Take by mouth. (Patient not taking: Reported on 06/05/2022)   [DISCONTINUED] meloxicam (MOBIC) 15 MG tablet Take 1 tablet (15 mg total) by mouth daily. (Patient not taking: Reported on 06/05/2022)   [DISCONTINUED] predniSONE (DELTASONE) 10 MG tablet Take 1 tablet (10 mg total) by mouth daily with breakfast. (Patient not taking: Reported on 06/27/2022)   [DISCONTINUED] Probiotic Product (PROBIOTIC-10 PO) Take by mouth. (Patient not taking: Reported on 06/19/2022)   No facility-administered encounter medications on file as of 06/27/2022.    Past Medical History:   Diagnosis Date   Heart murmur     History reviewed. No pertinent surgical history.  Family History  Problem Relation Age of Onset   Heart Problems Mother    Asthma Sister    Asthma Maternal Uncle    Kidney disease Maternal Grandfather     Social History   Socioeconomic History   Marital status: Single    Spouse name: Not on file   Number of children: Not on file   Years of education: Not on file   Highest education level: Not on file  Occupational History   Not on file  Tobacco Use   Smoking status: Never    Passive exposure: Never   Smokeless tobacco: Never  Vaping Use   Vaping Use: Never used  Substance and Sexual Activity   Alcohol use: No   Drug use: No   Sexual activity: Not Currently  Other Topics Concern   Not on file  Social History Narrative   Not on file   Social Determinants of Health   Financial Resource Strain: Not on file  Food Insecurity: Not on file  Transportation Needs: Not on file  Physical Activity: Not on file  Stress: Not on file  Social Connections: Not on file  Intimate Partner Violence: Not on file    ROS      Objective    Pulse (!) 107   Temp 98.6 F (37 C) (Oral)   Ht 5\' 6"  (1.676 m)   Wt 241 lb 6 oz (109.5  kg)   LMP 05/27/2022 (Approximate)   SpO2 97%   BMI 38.96 kg/m   Physical Exam Constitutional:      General: She is not in acute distress.    Appearance: She is not ill-appearing.  Cardiovascular:     Rate and Rhythm: Normal rate and regular rhythm.  Pulmonary:     Effort: Pulmonary effort is normal.     Breath sounds: Normal breath sounds.  Skin:    General: Skin is warm and dry.     Comments: Spider vein noted on right lower leg.   Neurological:     General: No focal deficit present.     Mental Status: She is alert and oriented to person, place, and time.     Sensory: No sensory deficit.     Motor: No weakness.     Gait: Gait normal.  Psychiatric:        Mood and Affect: Mood normal.         Behavior: Behavior normal.         Assessment & Plan:   Problem List Items Addressed This Visit       Cardiovascular and Mediastinum   Hot flashes    Check TSH      Relevant Orders   TSH (Completed)   T4, free (Completed)     Other   Abnormal bruising - Primary    No significant bruises observed today but she will continue to monitor. Check labs including CBC and platelet count.       Relevant Orders   CBC with Differential/Platelet (Completed)   Anxiety    Long hx of anxiety and recently worsened. Having a lot of health testing and new health concerns recently. Referral to psych.       Relevant Orders   TSH (Completed)   T4, free (Completed)   Ambulatory referral to Psychology   Polyarthralgia    Recently started on Plaquenil by rheumatology. Recommend close follow up.        Return if symptoms worsen or fail to improve.   Harland Dingwall, NP-C

## 2022-06-28 NOTE — Assessment & Plan Note (Signed)
No significant bruises observed today but she will continue to monitor. Check labs including CBC and platelet count.

## 2022-06-28 NOTE — Assessment & Plan Note (Signed)
Check TSH 

## 2022-06-28 NOTE — Assessment & Plan Note (Signed)
Recently started on Plaquenil by rheumatology. Recommend close follow up.

## 2022-06-28 NOTE — Assessment & Plan Note (Signed)
Long hx of anxiety and recently worsened. Having a lot of health testing and new health concerns recently. Referral to psych.

## 2022-06-30 ENCOUNTER — Telehealth: Payer: Medicaid Other | Admitting: Physician Assistant

## 2022-06-30 ENCOUNTER — Telehealth: Payer: Medicaid Other

## 2022-06-30 DIAGNOSIS — N946 Dysmenorrhea, unspecified: Secondary | ICD-10-CM

## 2022-06-30 DIAGNOSIS — N920 Excessive and frequent menstruation with regular cycle: Secondary | ICD-10-CM

## 2022-06-30 NOTE — Progress Notes (Signed)
Virtual Visit Consent   [name], you are scheduled for a virtual visit with a [facility] provider today. Just as with appointments in the office, your consent must be obtained to participate. Your consent will be active for this visit and any virtual visit you may have with one of our providers in the next 365 days. If you have a MyChart account, a copy of this consent can be sent to you electronically.  As this is a virtual visit, video technology does not allow for your provider to perform a traditional examination. This may limit your provider's ability to fully assess your condition. If your provider identifies any concerns that need to be evaluated in person or the need to arrange testing (such as labs, EKG, etc.), we will make arrangements to do so. Although advances in technology are sophisticated, we cannot ensure that it will always work on either your end or our end. If the connection with a video visit is poor, the visit may have to be switched to a telephone visit. With either a video or telephone visit, we are not always able to ensure that we have a secure connection.  By engaging in this virtual visit, you consent to the provision of healthcare and authorize for your insurance to be billed (if applicable) for the services provided during this visit. Depending on your insurance coverage, you may receive a charge related to this service.  I need to obtain your verbal consent now. Are you willing to proceed with your visit today? Norma Wright has provided verbal consent on 06/30/2022 for a virtual visit (video or telephone). Mar Daring, PA-C  Date: 06/30/2022 7:54 PM  Virtual Visit via Video Note   I, Mar Daring, connected with  Norma Wright  ([id number], July 05, 2001) on 06/30/22 at  7:30 PM EDT by a video-enabled telemedicine application and verified that I am speaking with the correct person using two identifiers.  Location: Patient: Virtual Visit Location  Patient: Home Provider: Virtual Visit Location Provider: Home Office   I discussed the limitations of evaluation and management by telemedicine and the availability of in person appointments. The patient expressed understanding and agreed to proceed.    History of Present Illness: Norma Wright is a 21 y.o. who identifies as a female who was assigned female at birth, and is being seen today for symptomatic menorrhagia and dysmenorrhea. Took an iron pill around 3 and one just before appt and then has also taken a B12. Reports she is changing a pad about every 30 minutes or so over the last 2 days. Having some mild lightheadedness, headache, and fatigue. No chest pain or shortness of breath. Last HgB was collected prior to start of menstruation and was 11.9 on 06/27/22. Recently started Hydroxychloroquine 200mg  for an undiagnosed possible autoimmune disorder on 06/19/22. Has never had a menstrual cycle like this before.    Problems:  Patient Active Problem List   Diagnosis Date Noted   Hot flashes 06/27/2022   Abnormal bruising 06/27/2022   Anxiety 06/27/2022   Positive ANA (antinuclear antibody) 06/19/2022   Sedimentation rate elevation 06/05/2022   Polyarthralgia 06/05/2022   Constipation 03/08/2022   LLQ pain 03/08/2022   Cystic acne 07/31/2021   Influenza vaccination declined 08/23/2017    Allergies: No Known Allergies Medications:  Current Outpatient Medications:    hydroxychloroquine (PLAQUENIL) 200 MG tablet, Take 1 tablet (200 mg total) by mouth daily., Disp: 30 tablet, Rfl: 2  Observations/Objective: Patient is well-developed, well-nourished in no acute  distress.  Resting comfortably at home.  Head is normocephalic, atraumatic.  No labored breathing.  Speech is clear and coherent with logical content.  Patient is alert and oriented at baseline.    Assessment and Plan: 1. Menorrhagia with regular cycle  2. Dysmenorrhea  - Discussed stopping Hydroxychloroquine for now  to see if it is causing the heavy menstruation as there are some cases of this occurring, though not a common side effect.  - Continue symptomatic management with iron 3 times daily, B12 and folate once or twice daily while on menstrual cycle - Add Ibuprofen and/or tylenol for pain/cramping - Magnesium glycinate 200-400 mg for cramping - Heating pad or patches for cramping - Push fluids - If symptoms worsen seek immediate care at the closest ER for symptomatic anemia - If symptoms improve follow up with Rheumatologist and PCP for further evaluation and treatment considerations for hydroxychlorquine continuation  Follow Up Instructions: I discussed the assessment and treatment plan with the patient. The patient was provided an opportunity to ask questions and all were answered. The patient agreed with the plan and demonstrated an understanding of the instructions.  A copy of instructions were sent to the patient via MyChart unless otherwise noted below.    The patient was advised to call back or seek an in-person evaluation if the symptoms worsen or if the condition fails to improve as anticipated.  Time:  I spent 12 minutes with the patient via telehealth technology discussing the above problems/concerns.    Mar Daring, PA-C

## 2022-06-30 NOTE — Patient Instructions (Signed)
Norma Wright, thank you for joining Margaretann Loveless, PA-C for today's virtual visit.  While this provider is not your primary care provider (PCP), if your PCP is located in our provider database this encounter information will be shared with them immediately following your visit.  A [facility] MyChart account gives you access to today's visit and all your visits, tests, and labs performed at [facility] " click here if you don't have a [facility] MyChart account or go to [email or web address]  Consent: (Patient) Norma Wright provided verbal consent for this virtual visit at the beginning of the encounter.  Current Medications:  Current Outpatient Medications:    hydroxychloroquine (PLAQUENIL) 200 MG tablet, Take 1 tablet (200 mg total) by mouth daily., Disp: 30 tablet, Rfl: 2   Medications ordered in this encounter:  No orders of the defined types were placed in this encounter.    *If you need refills on other medications prior to your next appointment, please contact your pharmacy*  Follow-Up: Call back or seek an in-person evaluation if the symptoms worsen or if the condition fails to improve as anticipated.  [facility] [phone]  Other Instructions  - Discussed stopping Hydroxychloroquine for now to see if it is causing the heavy menstruation as there are some cases of this occurring, though not a common side effect.  - Continue symptomatic management with iron 3 times daily, B12 and folate once or twice daily while on menstrual cycle - Add Ibuprofen and/or tylenol for pain/cramping - Magnesium glycinate 200-400 mg for cramping - Heating pad or patches for cramping - Push fluids - If symptoms worsen seek immediate care at the closest ER for symptomatic anemia - If symptoms improve follow up with Rheumatologist and PCP for further evaluation and treatment considerations for hydroxychlorquine continuation  Menorrhagia Menorrhagia is a  form of abnormal uterine bleeding in which menstrual periods are heavy or last longer than normal. With menorrhagia, the periods may cause enough blood loss and cramping that a woman becomes unable to take part in her usual activities. What are the causes? Common causes of this condition include: Polyps or fibroids. These are noncancerous growths in the uterus. An imbalance of the hormones estrogen and progesterone. Anovulation, which occurs when one of the ovaries does not release an egg during one or more months. A problem with the thyroid gland (hypothyroidism). Side effects of having an intrauterine device (IUD). Side effects of some medicines, such as NSAIDs or blood thinners. A bleeding disorder that stops the blood from clotting normally. In some cases, the cause of this condition is not known. What increases the risk? You are more likely to develop this condition if you have cancer of the uterus. What are the signs or symptoms? Symptoms of this condition include: Routinely having to change your pad or tampon every 1-2 hours because it is soaked. Needing to use pads and tampons at the same time because of heavy bleeding. Needing to wake up to change your pads or tampons during the night. Passing blood clots larger than 1 inch (2.5 cm) in size. Having bleeding that lasts for more than 7 days. Having symptoms of low iron levels (anemia), such as tiredness (fatigue) or shortness of breath. How is this diagnosed? This condition may be diagnosed based on: A physical exam. Your symptoms and menstrual history. Tests, such as: Blood tests to check if you are pregnant or if you have hormonal changes, a bleeding or thyroid disorder, anemia, or other problems.  Pap test to check for cancerous changes, infections, or inflammation. Endometrial biopsy. This test involves removing a tissue sample from the lining of the uterus (endometrium) to be examined under a microscope. Pelvic ultrasound.  This test uses sound waves to create images of your uterus, ovaries, and vagina. The images can show if you have fibroids or other growths. Hysteroscopy. For this test, a thin, flexible tube with a light on the end (hysteroscope) is used to look inside your uterus. How is this treated? Treatment may not be needed for this condition. If it is needed, the best treatment for you will depend on: Whether you need to prevent pregnancy. Your desire to have children in the future. The cause and severity of your bleeding. Your personal preference. Medicine Medicines are the first step in treatment. You may be treated with: Hormonal birth control methods. These treatments reduce bleeding during your menstrual period. They include: Birth control pills. Skin patch. Vaginal ring. Shots (injections) that you get every 3 months. Hormonal IUD. Implants that go under the skin. Medicines that thicken the blood and slow bleeding. Medicines that reduce swelling, such as ibuprofen. Medicines that contain an artificial (synthetic) hormone called progestin. Medicines that make the ovaries stop working for a short time. Iron supplements to treat anemia.  Surgery If medicines do not work, surgery may be done. Surgical options may include: Dilation and curettage (D&C). In this procedure, your health care provider opens the lowest part of the uterus (cervix) and then scrapes or suctions tissue from the endometrium. This reduces menstrual bleeding. Operative hysteroscopy. In this procedure, a hysteroscope is used to view your uterus and help remove polyps that may be causing heavy periods. Endometrial ablation. This is when various techniques are used to permanently destroy your entire endometrium. After endometrial ablation, most women have little or no menstrual flow. This procedure reduces your ability to become pregnant. Endometrial resection. In this procedure, an electrosurgical wire loop is used to remove the  endometrium. This procedure reduces your ability to become pregnant. Hysterectomy. This is surgical removal of your uterus. This is a permanent procedure that stops menstrual periods. Pregnancy is not possible after a hysterectomy. Follow these instructions at home: Medicines Take over-the-counter and prescription medicines only as told by your health care provider. This includes iron pills. Do not change or switch medicines without asking your health care provider. Do not take aspirin or medicines that contain aspirin 1 week before or during your menstrual period. Aspirin may make bleeding worse. Managing constipation Your iron pills may cause constipation. If you are taking prescription iron supplements, you may need to take these actions to prevent or treat constipation: Drink enough fluid to keep your urine pale yellow. Take over-the-counter or prescription medicines. Eat foods that are high in fiber, such as beans, whole grains, and fresh fruits and vegetables. Limit foods that are high in fat and processed sugars, such as fried or sweet foods. General instructions If you need to change your sanitary pad or tampon more than once every 2 hours, limit your activity until the bleeding stops. Eat well-balanced meals, including foods that are high in iron. Foods that have a lot of iron include leafy green vegetables, meat, liver, eggs, and whole-grain breads and cereals. Do not try to lose weight until the abnormal bleeding has stopped and your blood iron level is back to normal. If you need to lose weight, work with your health care provider to lose weight safely. Keep all follow-up visits. This is  important. Contact a health care provider if: You soak through a pad or tampon every 1 or 2 hours, and this happens every time you have a period. You need to use pads and tampons at the same time because you are bleeding so much. You have nausea, vomiting, diarrhea, or other problems related to  medicines you are taking. Get help right away if: You soak through more than a pad or tampon in 1 hour. You pass clots bigger than 1 inch (2.5 cm) wide. You feel short of breath. You feel like your heart is beating too fast. You feel dizzy or you faint. You feel very weak or tired. Summary Menorrhagia is a form of abnormal uterine bleeding in which menstrual periods are heavy or last longer than normal. Treatment may not be needed for this condition. If it is needed, it may include medicines or procedures. Take over-the-counter and prescription medicines only as told by your health care provider. This includes iron pills. Get help right away if you have heavy bleeding that soaks through more than a pad or tampon in 1 hour, you pass large clots, or you feel dizzy, short of breath, or very weak or tired. This information is not intended to replace advice given to you by your health care provider. Make sure you discuss any questions you have with your health care provider. Document Revised: 05/17/2020 Document Reviewed: 05/17/2020 Elsevier Patient Education  2023 Elsevier Inc.  Dysmenorrhea Dysmenorrhea refers to cramps caused by the muscles of the uterus tightening (contracting) during a menstrual period. Dysmenorrhea may be mild, or it may be severe enough to interfere with everyday activities for a few days each month. Primary dysmenorrhea is menstrual cramps that last a couple of days when a female starts having menstrual periods or soon after. As a female gets older or has a baby, the cramps will usually lessen or disappear. Secondary dysmenorrhea begins later in life and is caused by a disorder in the reproductive system. It lasts longer, and it may cause more pain than primary dysmenorrhea. The pain may start before the period and last a few days after the period. What are the causes? Dysmenorrhea is usually caused by an underlying problem, such as: Endometriosis. The tissue that lines the  uterus (endometrium) growing outside of the uterus in other areas of the body. Adenomyosis. Endometrial tissue growing into the muscular walls of the uterus. Pelvic congestive syndrome. Blood vessels in the pelvis that fill with blood just before the menstrual period. Overgrowth of cells (polyps) in the endometrium or the lower part of the uterus (cervix). Uterine prolapse. The uterus dropping down into the vagina due to stretched or weak muscles. Bladder problems, such as infection or inflammation. Intestinal problems, such as a tumor or irritable bowel syndrome. Cancer of the reproductive organs or bladder. Other causes of this condition may result from: A severely tipped uterus. A cervix that is closed or has a small opening. Noncancerous (benign) tumors in the uterus (fibroids). Pelvic inflammatory disease (PID). Pelvic scarring (adhesions) from a previous surgery. An ovarian cyst. An IUD (intrauterine device). What increases the risk? You are more likely to develop this condition if: You are younger than 19 years old. You started puberty early. You have irregular or heavy bleeding. You have never given birth. You have a family history of dysmenorrhea. You smoke or use nicotine products. You have high body weight or a low body weight. What are the signs or symptoms? Symptoms of this condition include: Cramping,  throbbing pain in lower abdomen or lower back, or a feeling of fullness in the lower abdomen. Periods lasting for longer than 7 days. Headaches. Bloating. Fatigue. Nausea or vomiting. Diarrhea or loose stools. Sweating or dizziness. How is this diagnosed? This condition may be diagnosed based on: Your symptoms. Your medical history. A physical exam. Blood tests. A Pap test. This is a test in which cells from the cervix are tested for signs of cancer or infection. A pregnancy test. You may also have other tests, including: Imaging tests, such as: Ultrasound. A  procedure to remove and examine a sample of endometrial tissue (dilation and curettage, D&C). A procedure to visually examine the inside of: The uterus (hysteroscopy). The abdomen or pelvis (laparoscopy). The bladder (cystoscopy). X-rays. CT scan. MRI. How is this treated? Treatment depends on the cause of the dysmenorrhea. Treatment may include medicines, such as: Pain medicines. Hormone replacement therapy. Injections of progesterone to stop the menstrual period. Birth control pills that contain the hormone progesterone. An IUD that contains the hormone progesterone. NSAIDs, such as ibuprofen. These may help to stop the production of hormones that cause cramps. Antidepressant medicines. Other treatment may include: Surgery to remove adhesions, endometriosis, ovarian cysts, fibroids, or the entire uterus (hysterectomy). Endometrial ablation. This is a procedure to destroy the endometrium. Presacral neurectomy. This is a procedure to cut the nerves in the bottom of the spine (sacrum) that go to the reproductive organs. Sacral nerve stimulation. This is a procedure to apply an electric current to nerves in the sacrum. Exercise and physical therapy. Meditation, yoga, and acupuncture. Work with your health care provider to determine what treatment or combination of treatments is best for you. Follow these instructions at home: Relieving pain and cramping  If directed, apply heat to your lower back or abdomen when you experience pain or cramps. Use the heat source that your health care provider recommends, such as a moist heat pack or a heating pad. Place a towel between your skin and the heat source. Leave the heat on for 20-30 minutes. Remove the heat if your skin turns bright red. This is especially important if you are unable to feel pain, heat, or cold. You may have a greater risk of getting burned. Do not sleep with a heating pad on. Exercise. Activities such as walking, swimming,  or biking can help to relieve cramps. Massage your lower back or abdomen to help relieve pain. General instructions Take over-the-counter and prescription medicines only as told by your health care provider. Ask your health care provider if the medicine prescribed to you requires you to avoid driving or using machinery. Avoid alcohol and caffeine during and right before your period. These can make cramps worse. Do not use any products that contain nicotine or tobacco. These products include cigarettes, chewing tobacco, and vaping devices, such as e-cigarettes. If you need help quitting, ask your health care provider. Keep all follow-up visits. This is important. Contact a health care provider if: You have pain that gets worse or does not get better with medicine. You have pain with sex. You develop nausea or vomiting with your period that is not controlled with medicine. Get help right away if: You faint. Summary Dysmenorrhea refers to cramps caused by the muscles of the uterus tightening (contracting) during a menstrual period. Dysmenorrhea may be mild, or it may be severe enough to interfere with everyday activities for a few days each month. Treatment depends on the cause of the dysmenorrhea. Work with your  health care provider to determine what treatment or combination of treatments is best for you. This information is not intended to replace advice given to you by your health care provider. Make sure you discuss any questions you have with your health care provider. Document Revised: 04/20/2020 Document Reviewed: 04/20/2020 Elsevier Patient Education  2023 Elsevier Inc.   If you have been instructed to have an in-person evaluation today at a local Urgent Care facility, please use the link below. It will take you to a list of all of our available [facility], including address, phone number and hours of operation. Please do not delay care.  [facility]  If  you or a family member do not have a primary care provider, use the link below to schedule a visit and establish care. When you choose a [facility] primary care physician or advanced practice provider, you gain a long-term partner in health. Find a Primary Care Provider  Learn more about [facility]'s in-office and virtual care options: [facility] - Get Care Now

## 2022-07-13 ENCOUNTER — Telehealth: Payer: Self-pay

## 2022-07-13 NOTE — Telephone Encounter (Signed)
Patient's mother called stating that her daughter started having some menstrual issues, hair loss on her head, and body hair growing in abnormal areas (chin and chest) after starting HCQ.   Mother stated that patient stopped taking the medication about 2 weeks ago after a visit to Urgent Care. The provider there recommended she stop taking until cycle was over, and to take Iron and B-12 and fluids.  Patient has started taking HCQ again for the past week and has started noticing abnormal hair growth again. Per Dr. Arlean Hopping recommendation, we recommended she stop taking the HCQ and speak to Dr. Benjamine Mola before restarting.   Please advise, thanks!

## 2022-07-18 ENCOUNTER — Encounter: Payer: Self-pay | Admitting: Family Medicine

## 2022-07-18 ENCOUNTER — Ambulatory Visit (INDEPENDENT_AMBULATORY_CARE_PROVIDER_SITE_OTHER): Payer: Medicaid Other | Admitting: Family Medicine

## 2022-07-18 VITALS — BP 124/76 | HR 85 | Temp 97.8°F | Ht 66.0 in | Wt 247.0 lb

## 2022-07-18 DIAGNOSIS — R0609 Other forms of dyspnea: Secondary | ICD-10-CM | POA: Diagnosis not present

## 2022-07-18 DIAGNOSIS — R079 Chest pain, unspecified: Secondary | ICD-10-CM | POA: Diagnosis not present

## 2022-07-18 DIAGNOSIS — N946 Dysmenorrhea, unspecified: Secondary | ICD-10-CM

## 2022-07-18 DIAGNOSIS — E669 Obesity, unspecified: Secondary | ICD-10-CM | POA: Diagnosis not present

## 2022-07-18 DIAGNOSIS — L689 Hypertrichosis, unspecified: Secondary | ICD-10-CM | POA: Diagnosis not present

## 2022-07-18 DIAGNOSIS — D8989 Other specified disorders involving the immune mechanism, not elsewhere classified: Secondary | ICD-10-CM | POA: Insufficient documentation

## 2022-07-18 DIAGNOSIS — R768 Other specified abnormal immunological findings in serum: Secondary | ICD-10-CM

## 2022-07-18 DIAGNOSIS — R002 Palpitations: Secondary | ICD-10-CM | POA: Diagnosis not present

## 2022-07-18 LAB — CBC WITH DIFFERENTIAL/PLATELET
Basophils Absolute: 0 10*3/uL (ref 0.0–0.1)
Basophils Relative: 0.6 % (ref 0.0–3.0)
Eosinophils Absolute: 0.2 10*3/uL (ref 0.0–0.7)
Eosinophils Relative: 4.1 % (ref 0.0–5.0)
HCT: 37.7 % (ref 36.0–49.0)
Hemoglobin: 12.3 g/dL (ref 12.0–16.0)
Lymphocytes Relative: 36 % (ref 24.0–48.0)
Lymphs Abs: 1.8 10*3/uL (ref 0.7–4.0)
MCHC: 32.7 g/dL (ref 31.0–37.0)
MCV: 87.7 fl (ref 78.0–98.0)
Monocytes Absolute: 0.8 10*3/uL (ref 0.1–1.0)
Monocytes Relative: 15.1 % — ABNORMAL HIGH (ref 3.0–12.0)
Neutro Abs: 2.2 10*3/uL (ref 1.4–7.7)
Neutrophils Relative %: 44.2 % (ref 43.0–71.0)
Platelets: 271 10*3/uL (ref 150.0–575.0)
RBC: 4.3 Mil/uL (ref 3.80–5.70)
RDW: 13.6 % (ref 11.4–15.5)
WBC: 5 10*3/uL (ref 4.5–13.5)

## 2022-07-18 LAB — BASIC METABOLIC PANEL
BUN: 9 mg/dL (ref 6–23)
CO2: 29 mEq/L (ref 19–32)
Calcium: 9.2 mg/dL (ref 8.4–10.5)
Chloride: 100 mEq/L (ref 96–112)
Creatinine, Ser: 0.72 mg/dL (ref 0.40–1.20)
GFR: 121.46 mL/min (ref >60.00)
Glucose, Bld: 103 mg/dL — ABNORMAL HIGH (ref 70–99)
Potassium: 3.6 mEq/L (ref 3.5–5.1)
Sodium: 136 mEq/L (ref 135–145)

## 2022-07-18 LAB — HEMOGLOBIN A1C: Hgb A1c MFr Bld: 5.5 % (ref 4.6–6.5)

## 2022-07-18 LAB — LIPID PANEL
Cholesterol: 154 mg/dL (ref 0–200)
HDL: 59.2 mg/dL (ref >39.00)
LDL Cholesterol: 87 mg/dL (ref 0–99)
NonHDL: 94.56
Total CHOL/HDL Ratio: 3
Triglycerides: 39 mg/dL (ref 0.0–149.0)
VLDL: 7.8 mg/dL (ref 0.0–40.0)

## 2022-07-18 LAB — T3, FREE: T3, Free: 4.1 pg/mL (ref 2.3–4.2)

## 2022-07-18 LAB — TSH: TSH: 4.91 u[IU]/mL (ref 0.40–5.00)

## 2022-07-18 LAB — T4, FREE: Free T4: 0.91 ng/dL (ref 0.60–1.60)

## 2022-07-18 NOTE — Patient Instructions (Addendum)
Please go downstairs for labs before you leave today.   When you feel your heart beating fast, check your pulse as I demonstrated today.   Avoid excessive caffeine or stimulants.   Make sure you are drinking water and are well hydrated.   Follow up in 1 week.    You can call to schedule your appointment with a psychiatrist/therapist.  A few offices are listed below for you to call.     [facility] [address] [place], [place] [address]  Phone: [phone]  [facility]  [address], [place], [place] [address]  Phone: [phone]            Palpitations Palpitations are feelings that your heartbeat is irregular or is faster than normal. It may feel like your heart is fluttering or skipping a beat. Palpitations may be caused by many things, including smoking, caffeine, alcohol, stress, and certain medicines or drugs. Most causes of palpitations are not serious.  However, some palpitations can be a sign of a serious problem. Further tests and a thorough medical history will be done to find the cause of your palpitations. Your provider may order tests such as an ECG, labs, an echocardiogram, or an ambulatory continuous ECG monitor. Follow these instructions at home: Pay attention to any changes in your symptoms. Let your health care provider know about them. Take these actions to help manage your symptoms: Eating and drinking Follow instructions from your health care provider about eating or drinking restrictions. You may need to avoid foods and drinks that may cause palpitations. These may include: Caffeinated coffee, tea, soft drinks, and energy drinks. Chocolate. Alcohol. Diet pills. Lifestyle     Take steps to reduce your stress and anxiety. Things that can help you relax include: Yoga. Mind-body activities, such as deep breathing, meditation, or using words and images to create positive thoughts  (guided imagery). Physical activity, such as swimming, jogging, or walking. Tell your health care provider if your palpitations increase with activity. If you have chest pain or shortness of breath with activity, do not continue the activity until you are seen by your health care provider. Biofeedback. This is a method that helps you learn to use your mind to control things in your body, such as your heartbeat. Get plenty of rest and sleep. Keep a regular bed time. Do not use drugs, including cocaine or ecstasy. Do not use marijuana. Do not use any products that contain nicotine or tobacco. These products include cigarettes, chewing tobacco, and vaping devices, such as e-cigarettes. If you need help quitting, ask your health care provider. General instructions Take over-the-counter and prescription medicines only as told by your health care provider. Keep all follow-up visits. This is important. These may include visits for further testing if palpitations do not go away or get worse. Contact a health care provider if: You continue to have a fast or irregular heartbeat for a long period of time. You notice that your palpitations occur more often. Get help right away if: You have chest pain or shortness of breath. You have a severe headache. You feel dizzy or you faint. These symptoms may represent a serious problem that is an emergency. Do not wait to see if the symptoms will go away. Get medical help right away. Call your local emergency services (911 in the [place] Do not drive yourself to the hospital. Summary Palpitations are feelings that your heartbeat is irregular or  is faster than normal. It may feel like your heart is fluttering or skipping a beat. Palpitations may be caused by many things, including smoking, caffeine, alcohol, stress, certain medicines, and drugs. Further tests and a thorough medical history may be done to find the cause of your palpitations. Get help right away if you  faint or have chest pain, shortness of breath, severe headache, or dizziness. This information is not intended to replace advice given to you by your health care provider. Make sure you discuss any questions you have with your health care provider. Document Revised: 01/25/2021 Document Reviewed: 01/25/2021 Elsevier Patient Education  [place].

## 2022-07-18 NOTE — Assessment & Plan Note (Signed)
Encourage healthy diet and exercise for weight loss

## 2022-07-18 NOTE — Assessment & Plan Note (Signed)
Under the care of of rheumatology

## 2022-07-18 NOTE — Progress Notes (Signed)
Subjective:     Patient ID: Norma Wright, female    DOB: 11-18-2000, 21 y.o.   MRN: [id number]  Chief Complaint  Patient presents with   Leg Pain    For about a week, left leg pulsing feeling up her leg all the way to her chest. States her body was so sore it was hard to move. Reports about 2 days ago pain moved over to her right leg. She does see rheumatologist and when she told him he advised to stop the plaquenil, but she also told him it may have given her hair growth but since stopping it she is still having the growth so doesn't think it was that    HPI Patient is in today for "pulsing sensation throughout my body".  Heart palpitations x one week. Shortness of breath with activity.  Feels her heart beating fast. Worse when she lays down.  States it occurs daily, several times per day and lasts for 5 minutes typically.   She also has intermittent chest pain   Denies fever, chills, dizziness, abdominal pain, N/V/D, urinary symptoms, LE edema.   Diagnosed with autoimmune disorder and is under the care of rheumatologist. Taking Plaquenil but not regularly.   Concern regarding increased hair growth on her chin and chest. Painful and heavy periods. States she thinks she may have PCOS. Does not have an OB/GYN.    Health Maintenance Due  Topic Date Due   HPV VACCINES (1 - 2-dose series) Never done   HIV Screening  Never done   Hepatitis C Screening  Never done   TETANUS/TDAP  Never done    Past Medical History:  Diagnosis Date   Heart murmur     History reviewed. No pertinent surgical history.  Family History  Problem Relation Age of Onset   Heart Problems Mother    Asthma Sister    Asthma Maternal Uncle    Kidney disease Maternal Grandfather     Social History   Socioeconomic History   Marital status: Single    Spouse name: Not on file   Number of children: Not on file   Years of education: Not on file   Highest education level: Not on file  Occupational  History   Not on file  Tobacco Use   Smoking status: Never    Passive exposure: Never   Smokeless tobacco: Never  Vaping Use   Vaping Use: Never used  Substance and Sexual Activity   Alcohol use: No   Drug use: No   Sexual activity: Not Currently  Other Topics Concern   Not on file  Social History Narrative   Not on file   Social Determinants of Health   Financial Resource Strain: Not on file  Food Insecurity: Not on file  Transportation Needs: Not on file  Physical Activity: Not on file  Stress: Not on file  Social Connections: Not on file  Intimate Partner Violence: Not on file    Outpatient Medications Prior to Visit  Medication Sig Dispense Refill   hydroxychloroquine (PLAQUENIL) 200 MG tablet Take 1 tablet (200 mg total) by mouth daily. (Patient not taking: Reported on 07/18/2022) 30 tablet 2   No facility-administered medications prior to visit.    No Known Allergies  ROS     Objective:    Physical Exam Constitutional:      General: She is not in acute distress.    Appearance: She is obese. She is not ill-appearing.  HENT:  Mouth/Throat:     Mouth: Mucous membranes are moist.  Eyes:     Conjunctiva/sclera: Conjunctivae normal.  Cardiovascular:     Rate and Rhythm: Normal rate and regular rhythm.     Heart sounds: Normal heart sounds.  Pulmonary:     Effort: Pulmonary effort is normal.     Breath sounds: Normal breath sounds.  Musculoskeletal:        General: Normal range of motion.  Skin:    General: Skin is warm and dry.  Neurological:     General: No focal deficit present.     Mental Status: She is alert and oriented to person, place, and time.     Motor: No weakness.     Gait: Gait normal.  Psychiatric:        Mood and Affect: Mood normal.        Behavior: Behavior normal.     BP 124/76 (BP Location: Left Arm, Patient Position: Sitting, Cuff Size: Large)   Pulse 85   Temp 97.8 F (36.6 C) (Temporal)   Ht 5\' 6"  (1.676 m)   Wt 247  lb (112 kg)   LMP 05/27/2022 (Approximate)   SpO2 98%   BMI 39.87 kg/m  Wt Readings from Last 3 Encounters:  07/18/22 247 lb (112 kg) (>99 %, Z= 2.46)*  06/27/22 241 lb 6 oz (109.5 kg) (>99 %, Z= 2.41)*  06/19/22 242 lb 12.8 oz (110.1 kg) (>99 %, Z= 2.42)*   * Growth percentiles are based on CDC (Girls, 2-20 Years) data.       Assessment & Plan:   Problem List Items Addressed This Visit       Genitourinary   Dysmenorrhea    Referral to OB/GYN      Relevant Orders   Ambulatory referral to Obstetrics / Gynecology   TSH (Completed)   T4, free (Completed)   T3, free (Completed)     Other   Autoimmune disorder (HCC)   Chest pain    Unclear etiology.  Unlikely ACS or PE. Check labs.  Follow-up in 1 week      Relevant Orders   CBC with Differential/Platelet (Completed)   Basic metabolic panel (Completed)   Iron, TIBC and Ferritin Panel   D-Dimer, Quantitative   EKG 12-Lead   DOE (dyspnea on exertion)    No acute distress.  Euvolemic.  Check labs including D-dimer, CBC and iron studies.  Look for underlying etiology and follow-up in 1 week or sooner if needed.      Relevant Orders   CBC with Differential/Platelet (Completed)   Basic metabolic panel (Completed)   D-Dimer, Quantitative   EKG 12-Lead   Excessive hair growth   Relevant Orders   TSH (Completed)   T4, free (Completed)   T3, free (Completed)   Intermittent palpitations - Primary    Demonstrated how to check her pulse the next time she feels that it is abnormal.  EKG unremarkable.  Check labs and have her follow-up with me in 1 week.  Consider event monitor or referral to cardiology      Relevant Orders   CBC with Differential/Platelet (Completed)   Basic metabolic panel (Completed)   TSH (Completed)   T4, free (Completed)   T3, free (Completed)   Iron, TIBC and Ferritin Panel   EKG 12-Lead   Obesity (BMI 30-39.9)    Encourage healthy diet and exercise for weight loss      Relevant Orders    CBC with Differential/Platelet (Completed)  Basic metabolic panel (Completed)   TSH (Completed)   T4, free (Completed)   T3, free (Completed)   Lipid panel (Completed)   Hemoglobin A1c (Completed)   Positive ANA (antinuclear antibody)    Under the care of of rheumatology       I am having Norma Wright maintain her hydroxychloroquine.  No orders of the defined types were placed in this encounter.

## 2022-07-18 NOTE — Assessment & Plan Note (Signed)
No acute distress.  Euvolemic.  Check labs including D-dimer, CBC and iron studies.  Look for underlying etiology and follow-up in 1 week or sooner if needed.

## 2022-07-18 NOTE — Assessment & Plan Note (Signed)
Referral to OB/GYN

## 2022-07-18 NOTE — Assessment & Plan Note (Signed)
Unclear etiology.  Unlikely ACS or PE. Check labs.  Follow-up in 1 week

## 2022-07-18 NOTE — Assessment & Plan Note (Signed)
Demonstrated how to check her pulse the next time she feels that it is abnormal.  EKG unremarkable.  Check labs and have her follow-up with me in 1 week.  Consider event monitor or referral to cardiology

## 2022-07-19 LAB — IRON,TIBC AND FERRITIN PANEL
%SAT: 7 % (calc) — ABNORMAL LOW (ref 15–45)
Ferritin: 15 ng/mL — ABNORMAL LOW (ref 16–154)
Iron: 28 ug/dL (ref 27–164)
TIBC: 393 mcg/dL (calc) (ref 271–448)

## 2022-07-19 LAB — D-DIMER, QUANTITATIVE: D-Dimer, Quant: 0.32 mcg/mL FEU (ref <0.50)

## 2022-07-25 ENCOUNTER — Ambulatory Visit (INDEPENDENT_AMBULATORY_CARE_PROVIDER_SITE_OTHER): Payer: Medicaid Other | Admitting: Family Medicine

## 2022-07-25 ENCOUNTER — Encounter: Payer: Self-pay | Admitting: Family Medicine

## 2022-07-25 VITALS — BP 122/72 | HR 97 | Temp 97.8°F | Ht 66.0 in | Wt 247.0 lb

## 2022-07-25 DIAGNOSIS — R002 Palpitations: Secondary | ICD-10-CM

## 2022-07-25 DIAGNOSIS — R5381 Other malaise: Secondary | ICD-10-CM

## 2022-07-25 DIAGNOSIS — E669 Obesity, unspecified: Secondary | ICD-10-CM

## 2022-07-25 NOTE — Progress Notes (Unsigned)
Subjective:     Patient ID: Norma Wright, female    DOB: Jun 30, 2001, 21 y.o.   MRN: [id number]  Chief Complaint  Patient presents with   Follow-up    1 week f/u    HPI Patient is in today for a 1 wk follow up for several concerns.   Only had 2 episodes of heart beating fast and lasted for 2 minutes.   Reports gaining 30 lbs in the past year. She used to be more active and walked to school. No really exercising over the past few months and recently started walking again. Gets short of breath with walking.   Mother has HF.   Health Maintenance Due  Topic Date Due   HPV VACCINES (1 - 2-dose series) Never done   HIV Screening  Never done   Hepatitis C Screening  Never done    Past Medical History:  Diagnosis Date   Heart murmur     History reviewed. No pertinent surgical history.  Family History  Problem Relation Age of Onset   Heart Problems Mother    Asthma Sister    Asthma Maternal Uncle    Kidney disease Maternal Grandfather     Social History   Socioeconomic History   Marital status: Single    Spouse name: Not on file   Number of children: Not on file   Years of education: Not on file   Highest education level: Not on file  Occupational History   Not on file  Tobacco Use   Smoking status: Never    Passive exposure: Never   Smokeless tobacco: Never  Vaping Use   Vaping Use: Never used  Substance and Sexual Activity   Alcohol use: No   Drug use: No   Sexual activity: Not Currently  Other Topics Concern   Not on file  Social History Narrative   Not on file   Social Determinants of Health   Financial Resource Strain: Not on file  Food Insecurity: Not on file  Transportation Needs: Not on file  Physical Activity: Not on file  Stress: Not on file  Social Connections: Not on file  Intimate Partner Violence: Not on file    Outpatient Medications Prior to Visit  Medication Sig Dispense Refill   hydroxychloroquine (PLAQUENIL) 200 MG tablet  Take 1 tablet (200 mg total) by mouth daily. (Patient not taking: Reported on 07/18/2022) 30 tablet 2   No facility-administered medications prior to visit.    No Known Allergies  ROS     Objective:    Physical Exam Constitutional:      General: She is not in acute distress.    Appearance: She is not ill-appearing.  Cardiovascular:     Rate and Rhythm: Normal rate.  Pulmonary:     Effort: Pulmonary effort is normal.  Neurological:     General: No focal deficit present.     Mental Status: She is alert and oriented to person, place, and time.  Psychiatric:        Mood and Affect: Mood normal.        Behavior: Behavior normal.        Thought Content: Thought content normal.     BP 122/72 (BP Location: Left Arm, Patient Position: Sitting, Cuff Size: Large)   Pulse 97   Temp 97.8 F (36.6 C) (Temporal)   Ht 5\' 6"  (1.676 m)   Wt 247 lb (112 kg)   SpO2 98%   BMI 39.87 kg/m  Wt Readings  from Last 3 Encounters:  07/25/22 247 lb (112 kg) (>99 %, Z= 2.46)*  07/18/22 247 lb (112 kg) (>99 %, Z= 2.46)*  06/27/22 241 lb 6 oz (109.5 kg) (>99 %, Z= 2.41)*   * Growth percentiles are based on CDC (Girls, 2-20 Years) data.       Assessment & Plan:   Problem List Items Addressed This Visit       Other   Intermittent palpitations   Obesity (BMI 30-39.9) - Primary   Physical deconditioning   Reviewed labs results from visit last week. She is starting to exercise again after taking several months off and having significant weight gain. Encouraged her to start slow and increase her exercise time and intensity.  Palpitations less frequent and shorter in length of time since cutting back on caffeine. She would like to follow up here in 4 wks. Recommend she schedule with OB/GYN and therapist.   I am having Nat Math maintain her hydroxychloroquine.  No orders of the defined types were placed in this encounter.

## 2022-07-25 NOTE — Patient Instructions (Addendum)
I am glad you are feeling better and having fewer episodes of palpitations.   Continue to avoid caffeine and stimulants that can trigger these.   Slowly get back to your usual exercise.   Follow up in 4 weeks or sooner if needed.   Remember to schedule with the OB/GYN and a therapist.

## 2022-08-03 ENCOUNTER — Encounter: Payer: Self-pay | Admitting: Radiology

## 2022-08-03 ENCOUNTER — Ambulatory Visit (INDEPENDENT_AMBULATORY_CARE_PROVIDER_SITE_OTHER): Payer: Medicaid Other | Admitting: Radiology

## 2022-08-03 VITALS — BP 114/76 | Ht 67.5 in | Wt 247.0 lb

## 2022-08-03 DIAGNOSIS — L680 Hirsutism: Secondary | ICD-10-CM

## 2022-08-03 DIAGNOSIS — N921 Excessive and frequent menstruation with irregular cycle: Secondary | ICD-10-CM

## 2022-08-03 NOTE — Progress Notes (Signed)
   Norma Wright Sep 12, 2001 [id number]   History:  21 y.o. G0 presents as a new patient with c/o heavy, long periods. Joint pain and positive CRP and ANA (rheum following), hair growth on chest and chin, scalp hair loss and acne. Last TSH 4.91 on 07/18/22  Gynecologic History Patient's last menstrual period was 07/27/2022 (approximate). Period Cycle (Days): 28 Period Duration (Days): 7 Period Pattern: Regular Menstrual Flow: Heavy Menstrual Control: Maxi pad Dysmenorrhea: (!) Severe Dysmenorrhea Symptoms: Cramping Contraception/Family planning: abstinence Sexually active: no   Obstetric History OB History  Gravida Para Term Preterm AB Living  0 0 0 0 0 0  SAB IAB Ectopic Multiple Live Births  0 0 0 0 0     The following portions of the patient's history were reviewed and updated as appropriate: allergies, current medications, past family history, past medical history, past social history, past surgical history, and problem list.  Review of Systems Pertinent items noted in HPI and remainder of comprehensive ROS otherwise negative.   Past medical history, past surgical history, family history and social history were all reviewed and documented in the EPIC chart.   Exam:  Vitals:   08/03/22 1156  BP: 114/76  Weight: 247 lb (112 kg)  Height: 5' 7.5" (1.715 m)   Body mass index is 38.11 kg/m.  General appearance:  Normal Thyroid:  Symmetrical, normal in size, without palpable masses or nodularity. Abdominal  Soft,nontender, without masses, guarding or rebound.  Liver/spleen:  No organomegaly noted  Hernia:  None appreciated Genitourinary   Inguinal/mons:  Normal without inguinal adenopathy  External genitalia:  Normal appearing vulva with no masses, tenderness, or lesions  BUS/Urethra/Skene's glands:  Normal without masses or exudate  Vagina:  Normal appearing with normal color and discharge, no lesions  Cervix:  Normal appearing without discharge or  lesions  Uterus:  Normal in size, shape and contour.  Mobile, nontender  Adnexa/parametria:     Rt: Normal in size, without masses or tenderness.   Lt: Normal in size, without masses or tenderness.  Anus and perineum: Normal  Chaperone offered and declined  Assessment/Plan:   1. Menorrhagia with irregular cycle  - Testos,Total,Free and SHBG (Female) - Estradiol - DHEA-sulfate - US Transvaginal Non-OB; Future  2. Hirsutism  - Testos,Total,Free and SHBG (Female)    Arlie Solomons B WHNP-BC 12:15 PM 08/03/2022

## 2022-08-13 LAB — ESTRADIOL: Estradiol: 35 pg/mL

## 2022-08-13 LAB — TESTOS,TOTAL,FREE AND SHBG (FEMALE)
Free Testosterone: 4.9 pg/mL (ref 0.1–6.4)
Sex Hormone Binding: 30 nmol/L (ref 17–124)
Testosterone, Total, LC-MS-MS: 32 ng/dL (ref 2–45)

## 2022-08-13 LAB — DHEA-SULFATE: DHEA-SO4: 186 ug/dL (ref 44–286)

## 2022-08-16 ENCOUNTER — Ambulatory Visit (INDEPENDENT_AMBULATORY_CARE_PROVIDER_SITE_OTHER): Payer: Medicaid Other | Admitting: Radiology

## 2022-08-16 ENCOUNTER — Ambulatory Visit (INDEPENDENT_AMBULATORY_CARE_PROVIDER_SITE_OTHER): Payer: Medicaid Other

## 2022-08-16 VITALS — BP 104/66

## 2022-08-16 DIAGNOSIS — N921 Excessive and frequent menstruation with irregular cycle: Secondary | ICD-10-CM | POA: Diagnosis not present

## 2022-08-16 NOTE — Progress Notes (Signed)
   Norma Wright 10/03/2000 [id number]   History:  21 y.o. here for ultrasound and review of labs. Had originally presented with menorrhagia and excess hair growth on chin.  Gynecologic History Patient's last menstrual period was 07/27/2022 (approximate).     Obstetric History OB History  Gravida Para Term Preterm AB Living  0 0 0 0 0 0  SAB IAB Ectopic Multiple Live Births  0 0 0 0 0     The following portions of the patient's history were reviewed and updated as appropriate: allergies, current medications, past family history, past medical history, past social history, past surgical history, and problem list.  Review of Systems Pertinent items noted in HPI and remainder of comprehensive ROS otherwise negative.   Past medical history, past surgical history, family history and social history were all reviewed and documented in the EPIC chart.     Latest Reference Range & Units 08/03/22 12:16  DHEA-SO4 44 - 286 mcg/dL 716  Estradiol pg/mL 35  Free Testosterone 0.1 - 6.4 pg/mL 4.9  Sex Horm Binding Glob, Serum 17 - 124 nmol/L 30.0  Testosterone, Total, LC-MS-MS 2 - 45 ng/dL 32    Exam:  Vitals:   08/16/22 1328  BP: 104/66   There is no height or weight on file to calculate BMI.  Indication: menorrhagia  Vaginal u/s   Anteverted uterus, normal size and shape No myometrial masses  Symmetrical secretory endometrium 7.63mm No masses or thickening seen  Both ovaries normal size with normal follicle pattern  1.3 x1.0cm resolving corpus luteum cyst   No adnexal masses or free fluid  Impression: normal gyn u/s  Assessment/Plan:   1. Menorrhagia with irregular cycle Reassured normal u/s and labs. No evidence of PCOS Discussed options for treatment including hormonal BC to regulate/lighten cycles, ibuprofen to lighted or. Lysteda. Patient will consider her options and get back in touch with me.  Arlie Solomons B WHNP-BC 2:08 PM 08/16/2022

## 2022-08-21 ENCOUNTER — Ambulatory Visit: Payer: Medicaid Other | Admitting: Internal Medicine

## 2022-08-22 ENCOUNTER — Ambulatory Visit (INDEPENDENT_AMBULATORY_CARE_PROVIDER_SITE_OTHER): Payer: Medicaid Other | Admitting: Family Medicine

## 2022-08-22 ENCOUNTER — Encounter: Payer: Self-pay | Admitting: Family Medicine

## 2022-08-22 VITALS — BP 114/76 | HR 90 | Temp 97.8°F | Ht 67.5 in | Wt 252.0 lb

## 2022-08-22 DIAGNOSIS — R002 Palpitations: Secondary | ICD-10-CM | POA: Diagnosis not present

## 2022-08-22 DIAGNOSIS — Z8249 Family history of ischemic heart disease and other diseases of the circulatory system: Secondary | ICD-10-CM

## 2022-08-22 DIAGNOSIS — R0609 Other forms of dyspnea: Secondary | ICD-10-CM

## 2022-08-22 NOTE — Patient Instructions (Signed)
Continue avoiding caffeine and other triggers for palpitations.   You will hear from [facility] to schedule a visit.

## 2022-08-22 NOTE — Progress Notes (Signed)
Subjective:     Patient ID: Norma Wright, female    DOB: [birth year], 21 y.o.   MRN: [id number]  Chief Complaint  Patient presents with   Follow-up    4 week f/u regarding palpitations, still having them from time to time still the same    HPI Patient is in today for having intermittent palpitations x several weeks. States she is still having them even when she does not have caffeine.  Last episodes last week.  She is also still having DOE. No new symptoms since our last visit.   No dizziness, chest pain, orthopnea, LE edema.   Mild anemia- she is taking iron sporadically. Most recent ferritin and iron fairly close to normal.  She has seen her OB/GYN and negative work up for PCOS.   Diagnosed with autoimmune disorder and is under the care of rheumatologist. Taking Plaquenil but not regularly. Has follow up visit later this month.   Health Maintenance Due  Topic Date Due   HPV VACCINES (1 - 2-dose series) Never done   DTaP/Tdap/Td (1 - Tdap) Never done    Past Medical History:  Diagnosis Date   Anemia    Heart murmur     History reviewed. No pertinent surgical history.  Family History  Problem Relation Age of Onset   Heart Problems Mother    Asthma Sister    Asthma Maternal Uncle    Kidney disease Maternal Grandfather     Social History   Socioeconomic History   Marital status: Single    Spouse name: Not on file   Number of children: Not on file   Years of education: Not on file   Highest education level: Not on file  Occupational History   Not on file  Tobacco Use   Smoking status: Never    Passive exposure: Never   Smokeless tobacco: Never  Vaping Use   Vaping Use: Never used  Substance and Sexual Activity   Alcohol use: No   Drug use: No   Sexual activity: Never    Partners: Male    Comment: menarche 14yo, never sexually active  Other Topics Concern   Not on file  Social History Narrative   Not on file   Social Determinants of Health    Financial Resource Strain: Not on file  Food Insecurity: Not on file  Transportation Needs: Not on file  Physical Activity: Not on file  Stress: Not on file  Social Connections: Not on file  Intimate Partner Violence: Not on file    Outpatient Medications Prior to Visit  Medication Sig Dispense Refill   Ferrous Sulfate (IRON PO) Take by mouth.     hydroxychloroquine (PLAQUENIL) 200 MG tablet Take 1 tablet (200 mg total) by mouth daily. (Patient not taking: Reported on 07/18/2022) 30 tablet 2   No facility-administered medications prior to visit.    No Known Allergies  ROS     Objective:    Physical Exam Constitutional:      General: She is not in acute distress.    Appearance: She is not ill-appearing.  Cardiovascular:     Rate and Rhythm: Normal rate and regular rhythm.  Pulmonary:     Effort: Pulmonary effort is normal.     Breath sounds: Normal breath sounds.  Musculoskeletal:     Right lower leg: No edema.     Left lower leg: No edema.  Skin:    General: Skin is warm and dry.  Neurological:  General: No focal deficit present.     Mental Status: She is alert and oriented to person, place, and time.  Psychiatric:        Mood and Affect: Mood normal.        Behavior: Behavior normal.        Thought Content: Thought content normal.     BP 114/76 (BP Location: Left Arm, Patient Position: Sitting, Cuff Size: Large)   Pulse 90   Temp 97.8 F (36.6 C) (Temporal)   Ht 5' 7.5" (1.715 m)   Wt 252 lb (114.3 kg)   LMP 07/27/2022 (Approximate)   SpO2 98%   BMI 38.89 kg/m  Wt Readings from Last 3 Encounters:  08/22/22 252 lb (114.3 kg) (>99 %, Z= 2.50)*  08/03/22 247 lb (112 kg) (>99 %, Z= 2.46)*  07/25/22 247 lb (112 kg) (>99 %, Z= 2.46)*   * Growth percentiles are based on CDC (Girls, 2-20 Years) data.       Assessment & Plan:   Problem List Items Addressed This Visit       Other   DOE (dyspnea on exertion)   Relevant Orders   Ambulatory  referral to Cardiology   Intermittent palpitations - Primary   Relevant Orders   Ambulatory referral to Cardiology   Other Visit Diagnoses     Family history of heart disease       Relevant Orders   Ambulatory referral to Cardiology      Palpitations still present even without caffeine intake and with good hydration. Continues having DOE.  Negative work up here last month. Normal thyroid, negative D-dimer and unremarkable EKG.  Referral to cardiologist.  Follow up with OB/GYN and rheumatology as recommended.   I have discontinued Revecca Bronaugh's hydroxychloroquine. I am also having her maintain her Ferrous Sulfate (IRON PO).  No orders of the defined types were placed in this encounter.

## 2022-09-07 ENCOUNTER — Ambulatory Visit: Payer: Medicaid Other | Admitting: Internal Medicine

## 2022-09-11 ENCOUNTER — Telehealth: Payer: Self-pay | Admitting: Family Medicine

## 2022-09-11 DIAGNOSIS — M2550 Pain in unspecified joint: Secondary | ICD-10-CM

## 2022-09-11 NOTE — Telephone Encounter (Signed)
Patient called and said that the rheumotologist that you referred her to has cancelled twice so she has still not seen the doctor.  Please refer her to someone else and let patient know.

## 2022-09-13 NOTE — Telephone Encounter (Signed)
ATC pt to inform of new referral placed to [facility] but VM not available

## 2022-09-13 NOTE — Telephone Encounter (Signed)
New referral placed.

## 2022-09-18 NOTE — Progress Notes (Unsigned)
Cardiology Office Note:    Date:  09/19/2022   ID:  Norma Wright, DOB 01-07-2001, MRN [id number]  PCP:  Girtha Rm, NP-C   [facility] Providers Cardiologist:  Lenna Sciara, MD Referring MD: Girtha Rm, NP-C   Chief Complaint/Reason for Referral: Palpitations  ASSESSMENT:    1. Palpitations   2. Dyspnea on exertion   3. BMI 38.0-38.9,adult   4. Snoring     PLAN:    In order of problems listed above: 1.  Palpitations: We will obtain an echocardiogram and monitor to evaluate further. 2.  Dyspnea on exertion: Will obtain an echocardiogram.   3.  Elevated BMI: I feel like this may be the source of her issues.  I do not think pharmacotherapy in a 22 year old is appropriate.  I have asked her to try diet and exercise for now.  I think abstaining from fast food which she has done over the last 4 days is a good start. 4.  Snoring: This is likely due to the patient's weight gain over the last few years.  Defer a sleep study for now in this 22 year old.  See discussion above #3.            Dispo:  Return in about 3 months (around 12/19/2022).      Medication Adjustments/Labs and Tests Ordered: Current medicines are reviewed at length with the patient today.  Concerns regarding medicines are outlined above.  The following changes have been made:  no change   Labs/tests ordered: Orders Placed This Encounter  Procedures   LONG TERM MONITOR (3-14 DAYS)   ECHOCARDIOGRAM COMPLETE    Medication Changes: No orders of the defined types were placed in this encounter.    Current medicines are reviewed at length with the patient today.  The patient does not have concerns regarding medicines.   History of Present Illness:    FOCUSED PROBLEM LIST:   1.  Iron deficiency anemia 2.  BMI of 38  The patient is a 22 y.o. female with the indicated medical history here for recommendations regarding palpitations.  She saw her PCP recently with complaints of palpitations  that started a few months ago as well as exertional dyspnea.  She had laboratories checked which demonstrated normal TSH and BMP.  The patient tells me she has had palpitations for a number of years but they got worse recently.  However over the last few days she has been eating better and avoiding fast food and this does seem to help her burden of palpitations.  She denies any significant shortness of breath.  She has had no severe chest pain consistent with angina however she does get occasional sharp atypical pains that lasts seconds and are fleeting.  She denies any syncope but she has had some presyncope on occasion.  This is accompanied by feeling flushed and then states of pain consistent with a vasovagal episode.  She has required no hospitalizations or emergency room visits recently.  Of note she has gained around 50 pounds over the last few years.         Current Medications: No outpatient medications have been marked as taking for the 09/19/22 encounter (Office Visit) with Early Osmond, MD.     Allergies:    Patient has no known allergies.   Social History:   Social History   Tobacco Use   Smoking status: Never    Passive exposure: Never   Smokeless tobacco: Never  Vaping Use   Vaping Use:  Never used  Substance Use Topics   Alcohol use: No   Drug use: No     Family Hx: Family History  Problem Relation Age of Onset   Heart Problems Mother    Asthma Sister    Asthma Maternal Uncle    Kidney disease Maternal Grandfather      Review of Systems:   Please see the history of present illness.    All other systems reviewed and are negative.     EKGs/Labs/Other Test Reviewed:    EKG:  EKG performed November 2023 that I personally reviewed demonstrates sinus rhythm.  Prior CV studies: None available  Other studies Reviewed: Review of the additional studies/records demonstrates: No imaging evidence available demonstrating aortic atherosclerosis or coronary artery  calcification  Recent Labs: 04/24/2022: ALT 22 07/18/2022: BUN 9; Creatinine, Ser 0.72; Hemoglobin 12.3; Platelets 271.0; Potassium 3.6; Sodium 136; TSH 4.91   Recent Lipid Panel Lab Results  Component Value Date/Time   CHOL 154 07/18/2022 10:33 AM   TRIG 39.0 07/18/2022 10:33 AM   HDL 59.20 07/18/2022 10:33 AM   LDLCALC 87 07/18/2022 10:33 AM    Risk Assessment/Calculations:                Physical Exam:    VS:  BP 110/60   Pulse (!) 101   Ht 5\' 7"  (1.702 m)   Wt 246 lb 3.2 oz (111.7 kg)   SpO2 98%   BMI 38.56 kg/m    Wt Readings from Last 3 Encounters:  09/19/22 246 lb 3.2 oz (111.7 kg) (>99 %, Z= 2.46)*  08/22/22 252 lb (114.3 kg) (>99 %, Z= 2.50)*  08/03/22 247 lb (112 kg) (>99 %, Z= 2.46)*   * Growth percentiles are based on CDC (Girls, 2-20 Years) data.    GENERAL:  No apparent distress, AOx3 HEENT:  No carotid bruits, +2 carotid impulses, no scleral icterus CAR: RRR no murmurs, gallops, rubs, or thrills RES:  Clear to auscultation bilaterally ABD:  Soft, nontender, nondistended, positive bowel sounds x 4 VASC:  +2 radial pulses, +2 carotid pulses, palpable pedal pulses NEURO:  CN 2-12 grossly intact; motor and sensory grossly intact PSYCH:  No active depression or anxiety EXT:  No edema, ecchymosis, or cyanosis  Signed, Early Osmond, MD  09/19/2022 4:55 PM    [facility] [place], [place], [place]  [address] Phone: [phone]; Fax: [phone]   Note:  This document was prepared using Dragon voice recognition software and may include unintentional dictation errors.

## 2022-09-19 ENCOUNTER — Ambulatory Visit: Payer: Medicaid Other | Attending: Internal Medicine | Admitting: Internal Medicine

## 2022-09-19 ENCOUNTER — Ambulatory Visit (INDEPENDENT_AMBULATORY_CARE_PROVIDER_SITE_OTHER): Payer: Medicaid Other

## 2022-09-19 ENCOUNTER — Encounter: Payer: Self-pay | Admitting: Internal Medicine

## 2022-09-19 VITALS — BP 110/60 | HR 101 | Ht 67.0 in | Wt 246.2 lb

## 2022-09-19 DIAGNOSIS — Z6838 Body mass index (BMI) 38.0-38.9, adult: Secondary | ICD-10-CM

## 2022-09-19 DIAGNOSIS — R002 Palpitations: Secondary | ICD-10-CM | POA: Diagnosis not present

## 2022-09-19 DIAGNOSIS — R0609 Other forms of dyspnea: Secondary | ICD-10-CM | POA: Diagnosis not present

## 2022-09-19 DIAGNOSIS — R0683 Snoring: Secondary | ICD-10-CM | POA: Diagnosis not present

## 2022-09-19 NOTE — Progress Notes (Unsigned)
Enrolled patient for a 7 day Zio XT monitor to be mailed to patients home.  

## 2022-09-19 NOTE — Patient Instructions (Addendum)
Medication Instructions:  No changes *If you need a refill on your cardiac medications before your next appointment, please call your pharmacy*   Lab Work: none   Testing/Procedures: Your physician has requested that you have an echocardiogram. Echocardiography is a painless test that uses sound waves to create images of your heart. It provides your doctor with information about the size and shape of your heart and how well your heart's chambers and valves are working. This procedure takes approximately one hour. There are no restrictions for this procedure. Please do NOT wear cologne, perfume, aftershave, or lotions (deodorant is allowed). Please arrive 15 minutes prior to your appointment time.   ZIO XT- Long Term Monitor Instructions  Your physician has requested you wear a ZIO patch monitor for 7 days.  This is a single patch monitor. [employer] supplies one patch monitor per enrollment. Additional stickers are not available. Please do not apply patch if you will be having a Nuclear Stress Test,  Echocardiogram, Cardiac CT, MRI, or Chest Xray during the period you would be wearing the  monitor. The patch cannot be worn during these tests. You cannot remove and re-apply the  ZIO XT patch monitor.  Your ZIO patch monitor will be mailed 3 day USPS to your address on file. It may take 3-5 days  to receive your monitor after you have been enrolled.  Once you have received your monitor, please review the enclosed instructions. Your monitor  has already been registered assigning a specific monitor serial # to you.  Billing and Patient Assistance Program Information  We have supplied [employer] with any of your insurance information on file for billing purposes. [employer] offers a sliding scale Patient Assistance Program for patients that do not have  insurance, or whose insurance does not completely cover the cost of the ZIO monitor.  You must apply for the Patient Assistance Program to qualify  for this discounted rate.  To apply, please call [employer] at [phone], select option 4, select option 2, ask to apply for  Patient Assistance Program. Theodore Demark will ask your household income, and how many people  are in your household. They will quote your out-of-pocket cost based on that information.  [employer] will also be able to set up a 46-month, interest-free payment plan if needed.  Applying the monitor   Shave hair from upper left chest.  Hold abrader disc by orange tab. Rub abrader in 40 strokes over the upper left chest as  indicated in your monitor instructions.  Clean area with 4 enclosed alcohol pads. Let dry.  Apply patch as indicated in monitor instructions. Patch will be placed under collarbone on left  side of chest with arrow pointing upward.  Rub patch adhesive wings for 2 minutes. Remove white label marked "1". Remove the white  label marked "2". Rub patch adhesive wings for 2 additional minutes.  While looking in a mirror, press and release button in center of patch. A small green light will  flash 3-4 times. This will be your only indicator that the monitor has been turned on.  Do not shower for the first 24 hours. You may shower after the first 24 hours.  Press the button if you feel a symptom. You will hear a small click. Record Date, Time and  Symptom in the Patient Logbook.  When you are ready to remove the patch, follow instructions on the last 2 pages of Patient  Logbook. Stick patch monitor onto the last page of Patient Logbook.  Place Patient Logbook in the blue and white box. Use locking tab on box and tape box closed  securely. The blue and white box has prepaid postage on it. Please place it in the mailbox as  soon as possible. Your physician should have your test results approximately 7 days after the  monitor has been mailed back to [facility].  Call [facility] [place] at [phone] if you have questions regarding  your ZIO XT patch  monitor. Call them immediately if you see an orange light blinking on your  monitor.  If your monitor falls off in less than 4 days, contact our Monitor department at [phone].  If your monitor becomes loose or falls off after 4 days call [employer] at [phone] for  suggestions on securing your monitor    Follow-Up: At [facility], you and your health needs are our priority.  As part of our continuing mission to provide you with exceptional heart care, we have created designated Provider Care Teams.  These Care Teams include your primary Cardiologist (physician) and Advanced Practice Providers (APPs -  Physician Assistants and Nurse Practitioners) who all work together to provide you with the care you need, when you need it.   Your next appointment:   3-4 month(s)  The format for your next appointment:   In Person  Provider:   Early Osmond, MD     Other Instructions    Weight: Know what a healthy weight is for you (roughly BMI <25) and aim to maintain this. You can calculate your body mass index on your smart phone  Diet: Aim for 7+ servings of fruits and vegetables daily Limit animal fats in diet for cholesterol and heart health - choose grass fed whenever available Avoid highly processed foods (fast food burgers, tacos, fried chicken, pizza, hot dogs, french fries)  Saturated fat comes in the form of butter, lard, coconut oil, margarine, partially hydrogenated oils, and fat in meat. These increase your risk of cardiovascular disease.  Use healthy plant oils, such as olive, canola, soy, corn, sunflower and peanut.  Whole foods such as fruits, vegetables and whole grains have fiber  Men need > 38 grams of fiber per day Women need > 25 grams of fiber per day  Load up on vegetables and fruits - one-half of your plate: Aim for color and variety, and remember that potatoes dont count. Go for whole grains - one-quarter of your plate: Whole wheat, barley, wheat  berries, quinoa, oats, brown rice, and foods made with them. If you want pasta, go with whole wheat pasta. Protein power - one-quarter of your plate: Fish, chicken, beans, and nuts are all healthy, versatile protein sources. Limit red meat. You need carbohydrates for energy! The type of carbohydrate is more important than the amount. Choose carbohydrates such as vegetables, fruits, whole grains, beans, and nuts in the place of white rice, white pasta, potatoes (baked or fried), macaroni and cheese, cakes, cookies, and donuts.  If youre thirsty, drink water. Coffee and tea are good in moderation, but skip sugary drinks and limit milk and dairy products to one or two daily servings. Keep sugar intake at 6 teaspoons or 24 grams or LESS       Exercise: Aim for 150 min of moderate intensity exercise weekly for heart health, and weights twice weekly for bone health Stay active - any steps are better than no steps! Aim for 7-9 hours of sleep daily

## 2022-09-20 ENCOUNTER — Telehealth: Payer: Self-pay | Admitting: Internal Medicine

## 2022-09-20 NOTE — Telephone Encounter (Signed)
FYI:  Called patient to reschedule appointment.  Patient's mother stated that her daughter will not be returning to our office.  She states her daughter had an appointment with Dr. Benjamine Mola in October and after that appointment had a couple of questions and left a message.  She states the call was never returned by the doctor and the next two scheduled appointments (08/21/22 and 09/07/22) were then cancelled by the doctor.

## 2022-10-04 DIAGNOSIS — R0683 Snoring: Secondary | ICD-10-CM

## 2022-10-04 DIAGNOSIS — R0609 Other forms of dyspnea: Secondary | ICD-10-CM | POA: Diagnosis not present

## 2022-10-04 DIAGNOSIS — R002 Palpitations: Secondary | ICD-10-CM | POA: Diagnosis not present

## 2022-10-04 DIAGNOSIS — Z6838 Body mass index (BMI) 38.0-38.9, adult: Secondary | ICD-10-CM | POA: Diagnosis not present

## 2022-10-09 ENCOUNTER — Telehealth: Payer: Self-pay | Admitting: Family Medicine

## 2022-10-09 DIAGNOSIS — M2550 Pain in unspecified joint: Secondary | ICD-10-CM

## 2022-10-09 NOTE — Telephone Encounter (Signed)
Patient needs a referral to rheumatology - [facility]  [phone]  phone - [phone]  fax number

## 2022-10-10 NOTE — Telephone Encounter (Signed)
ATC pt to see why she needs new referral as we just placed another new referral to [facility]. Could not leave VM

## 2022-10-10 NOTE — Telephone Encounter (Signed)
Referral replaced

## 2022-10-10 NOTE — Addendum Note (Signed)
Addended by: Rossie Muskrat on: 10/10/2022 09:24 AM   Modules accepted: Orders

## 2022-10-15 ENCOUNTER — Ambulatory Visit (HOSPITAL_COMMUNITY): Payer: Medicaid Other | Attending: Internal Medicine

## 2022-10-15 DIAGNOSIS — Z6838 Body mass index (BMI) 38.0-38.9, adult: Secondary | ICD-10-CM | POA: Diagnosis present

## 2022-10-15 DIAGNOSIS — R002 Palpitations: Secondary | ICD-10-CM | POA: Diagnosis not present

## 2022-10-15 DIAGNOSIS — R0609 Other forms of dyspnea: Secondary | ICD-10-CM | POA: Diagnosis not present

## 2022-10-15 DIAGNOSIS — R0683 Snoring: Secondary | ICD-10-CM | POA: Diagnosis present

## 2022-10-15 LAB — ECHOCARDIOGRAM COMPLETE
Area-P 1/2: 3.27 cm2
S' Lateral: 3.5 cm

## 2022-12-07 ENCOUNTER — Ambulatory Visit
Admission: RE | Admit: 2022-12-07 | Discharge: 2022-12-07 | Disposition: A | Discharge status: Home / Self Care | Payer: Medicaid Other | Source: Ambulatory Visit

## 2022-12-07 VITALS — BP 121/80 | HR 77 | Temp 98.5°F | Resp 16

## 2022-12-07 DIAGNOSIS — L989 Disorder of the skin and subcutaneous tissue, unspecified: Secondary | ICD-10-CM

## 2022-12-07 NOTE — ED Provider Notes (Signed)
[facility]    CSN: [id number] Arrival date & time: 12/07/22  1633      History   Chief Complaint Chief Complaint  Patient presents with   Skin Ulcer    Entered by patient    HPI Norma Wright is a 22 y.o. female.   Pt complains of a bump on her right lower abdomen. Pt reports she noticed area about a month ago.  Reports area is uncomfortable.  Patient reports that she tried to drain it at home and she had some substance come out of it.  The history is provided by the patient. No language interpreter was used.    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 No pertinent surgical history.  OB History     Gravida  0   Para  0   Term  0   Preterm  0   AB  0   Living  0      SAB  0   IAB  0   Ectopic  0   Multiple  0   Live Births  0            Home Medications    Prior to Admission medications   Medication Sig Start Date End Date Taking? Authorizing Provider  gabapentin (NEURONTIN) 300 MG capsule Take by mouth. 11/23/22 11/23/23 Yes [provider]  Ferrous Sulfate (IRON PO) Take by mouth.    [provider]    Family History Family History  Problem Relation Age of Onset   Heart Problems Mother    Asthma Sister    Asthma Maternal  Uncle    Kidney disease Maternal Grandfather     Social History Social History   Tobacco Use   Smoking status: Never    Passive exposure: Never   Smokeless tobacco: Never  Vaping Use   Vaping Use: Never used  Substance Use Topics   Alcohol use: No   Drug use: No     Allergies   Patient has no known allergies.   Review of Systems Review of Systems  All other systems reviewed and are negative.    Physical Exam Triage Vital Signs ED Triage Vitals [12/07/22 1656]  Enc Vitals Group     BP 121/80     Pulse Rate 77     Resp 16     Temp 98.5 F (36.9 C)     Temp Source Oral     SpO2 97 %     Weight      Height      Head Circumference      Peak Flow      Pain Score 7     Pain Loc      Pain Edu?      Excl. in [place]?  No data found.  Updated Vital Signs BP 121/80 (BP Location: Right Arm)   Pulse 77   Temp 98.5 F (36.9 C) (Oral)   Resp 16   LMP 11/30/2022   SpO2 97%   Visual Acuity Right Eye Distance:   Left Eye Distance:   Bilateral Distance:    Right Eye Near:   Left Eye Near:    Bilateral Near:     Physical Exam Vitals reviewed.  Constitutional:      Appearance: Normal appearance.  Cardiovascular:     Rate and Rhythm: Normal rate.  Pulmonary:     Effort: Pulmonary effort is normal.  Abdominal:     Comments: 2 mm dark area that appears to be vascular.  Musculoskeletal:        General: Swelling present.  Skin:    General: Skin is warm.  Neurological:     General: No focal deficit present.     Mental Status: She is alert.  Psychiatric:        Mood and Affect: Mood normal.      UC Treatments / Results  Labs (all labs ordered are listed, but only abnormal results are displayed) Labs Reviewed - No data to display  EKG   Radiology No results found.  Procedures Procedures (including critical care time)  Medications Ordered in UC Medications - No data to display  Initial Impression / Assessment and Plan / UC Course  I have  reviewed the triage vital signs and the nursing notes.  Pertinent labs & imaging results that were available during my care of the patient were reviewed by me and considered in my medical decision making (see chart for details).     Appears to be a small varicosity.  Patient reports area concerns her.  I advised her to follow-up with dermatology for evaluation patient is given the phone number for [facility] dermatology.  She has been seen at [facility] dermatology in the past.  Patient did get advised to call for appointment. Final Clinical Impressions(s) / UC Diagnoses   Final diagnoses:  Skin lesion     Discharge Instructions      Watch area for increased size or changes.     ED Prescriptions   None    PDMP not reviewed this encounter.   Fransico Meadow, [place] 12/07/22 [id number]

## 2022-12-07 NOTE — Discharge Instructions (Addendum)
Watch area for increased size or changes.

## 2022-12-07 NOTE — ED Triage Notes (Signed)
Pt c/o bump to left groin region that appeared about a month.   Home interventions: warm compress

## 2023-01-30 NOTE — Progress Notes (Signed)
Cardiology Office Note:    Date:  02/04/2023   ID:  Norma Wright, DOB 02-25-2001, MRN [id number]  PCP:  Avanell Shackleton, NP-C   [facility] Providers Cardiologist:  Alverda Skeans, MD Referring MD: Avanell Shackleton, NP-C   Chief Complaint/Reason for Referral: Palpitations  ASSESSMENT:    1. Palpitations   2. BMI 38.0-38.9,adult   3. Snoring      PLAN:    In order of problems listed above: 1.  Palpitations: Reassuring echocardiogram and monitor. 2.  Elevated BMI: Reiterated need for diet and exercise. 4.  Snoring: Will refer for sleep study.            Dispo:  Return if symptoms worsen or fail to improve.      Medication Adjustments/Labs and Tests Ordered: Current medicines are reviewed at length with the patient today.  Concerns regarding medicines are outlined above.  The following changes have been made:  no change   Labs/tests ordered: Orders Placed This Encounter  Procedures   [place] Sleep Study    Medication Changes: No orders of the defined types were placed in this encounter.    Current medicines are reviewed at length with the patient today.  The patient does not have concerns regarding medicines.   History of Present Illness:    FOCUSED PROBLEM LIST:   1.  Iron deficiency anemia 2.  BMI of 38 3.  Fibromyalgia  January 2024: The patient is a 22 y.o. female with the indicated medical history here for recommendations regarding palpitations.  She saw her PCP recently with complaints of palpitations that started a few months ago as well as exertional dyspnea.  She had laboratories checked which demonstrated normal TSH and BMP.  The patient tells me she has had palpitations for a number of years but they got worse recently.  However over the last few days she has been eating better and avoiding fast food and this does seem to help her burden of palpitations.  She denies any significant shortness of breath.  She has had no severe chest pain  consistent with angina however she does get occasional sharp atypical pains that lasts seconds and are fleeting.  She denies any syncope but she has had some presyncope on occasion.  This is accompanied by feeling flushed and then states of pain consistent with a vasovagal episode.  She has required no hospitalizations or emergency room visits recently.  Of note she has gained around 50 pounds over the last few years.  Plan: Obtain monitor and echocardiogram.  Today: In the interim the patient had an echocardiogram and monitor which were both reassuring.  In the interim she was diagnosed with fibromyalgia.  She was started on Neurontin.  She is not sure if the Neurontin has helped but she will be following up with the rheumatologist in a few months.  She tells me that her palpitations are much better.  She tells me she does snore and does suffer from some daytime somnolence.  She was seen in March in the emergency department for an area of irritation on her right lower abdomen.  She was not hospitalized.        Current Medications: Current Meds  Medication Sig   Ferrous Sulfate (IRON PO) Take by mouth.   gabapentin (NEURONTIN) 300 MG capsule Take by mouth.     Allergies:    Patient has no known allergies.   Social History:   Social History   Tobacco Use   Smoking status:  Never    Passive exposure: Never   Smokeless tobacco: Never  Vaping Use   Vaping Use: Never used  Substance Use Topics   Alcohol use: No   Drug use: No     Family Hx: Family History  Problem Relation Age of Onset   Heart Problems Mother    Asthma Sister    Asthma Maternal Uncle    Kidney disease Maternal Grandfather      Review of Systems:   Please see the history of present illness.    All other systems reviewed and are negative.     EKGs/Labs/Other Test Reviewed:    EKG:  EKG performed November 2023 that I personally reviewed demonstrates sinus rhythm.  Prior CV studies: None available  Other  studies Reviewed: Review of the additional studies/records demonstrates: No imaging evidence available demonstrating aortic atherosclerosis or coronary artery calcification  Recent Labs: 04/24/2022: ALT 22 07/18/2022: BUN 9; Creatinine, Ser 0.72; Hemoglobin 12.3; Platelets 271.0; Potassium 3.6; Sodium 136; TSH 4.91   Recent Lipid Panel Lab Results  Component Value Date/Time   CHOL 154 07/18/2022 10:33 AM   TRIG 39.0 07/18/2022 10:33 AM   HDL 59.20 07/18/2022 10:33 AM   LDLCALC 87 07/18/2022 10:33 AM    Risk Assessment/Calculations:      STOP-Bang Score:  4            Physical Exam:    VS:  BP 102/62   Pulse 76   Ht 5\' 7"  (1.702 m)   Wt 246 lb 6.4 oz (111.8 kg)   SpO2 99%   BMI 38.59 kg/m    Wt Readings from Last 3 Encounters:  02/04/23 246 lb 6.4 oz (111.8 kg) (>99 %, Z= 2.48)*  09/19/22 246 lb 3.2 oz (111.7 kg) (>99 %, Z= 2.46)*  08/22/22 252 lb (114.3 kg) (>99 %, Z= 2.50)*   * Growth percentiles are based on CDC (Girls, 2-20 Years) data.    GENERAL:  No apparent distress, AOx3 HEENT:  No carotid bruits, +2 carotid impulses, no scleral icterus CAR: RRR no murmurs, gallops, rubs, or thrills RES:  Clear to auscultation bilaterally ABD:  Soft, nontender, nondistended, positive bowel sounds x 4 VASC:  +2 radial pulses, +2 carotid pulses, palpable pedal pulses NEURO:  CN 2-12 grossly intact; motor and sensory grossly intact PSYCH:  No active depression or anxiety EXT:  No edema, ecchymosis, or cyanosis  Signed, Norma Pyo, MD  02/04/2023 2:00 PM    [facility] [address] [place], [place], [place]  [address] Phone: [phone]; Fax: [phone]   Note:  This document was prepared using Dragon voice recognition software and may include unintentional dictation errors.

## 2023-02-04 ENCOUNTER — Ambulatory Visit: Payer: Medicaid Other | Attending: Internal Medicine | Admitting: Internal Medicine

## 2023-02-04 ENCOUNTER — Encounter: Payer: Self-pay | Admitting: Internal Medicine

## 2023-02-04 ENCOUNTER — Telehealth: Payer: Self-pay

## 2023-02-04 VITALS — BP 102/62 | HR 76 | Ht 67.0 in | Wt 246.4 lb

## 2023-02-04 DIAGNOSIS — Z6838 Body mass index (BMI) 38.0-38.9, adult: Secondary | ICD-10-CM | POA: Diagnosis not present

## 2023-02-04 DIAGNOSIS — R0683 Snoring: Secondary | ICD-10-CM

## 2023-02-04 DIAGNOSIS — R002 Palpitations: Secondary | ICD-10-CM | POA: Diagnosis not present

## 2023-02-04 NOTE — Patient Instructions (Signed)
Medication Instructions:  Your physician recommends that you continue on your current medications as directed. Please refer to the Current Medication list given to you today.  *If you need a refill on your cardiac medications before your next appointment, please call your pharmacy*  Testing/Procedures: Your physician has recommended that you have a sleep study. This test records several body functions during sleep, including: brain activity, eye movement, oxygen and carbon dioxide blood levels, heart rate and rhythm, breathing rate and rhythm, the flow of air through your mouth and nose, snoring, body muscle movements, and chest and belly movement.    Follow-Up: At [facility], you and your health needs are our priority.  As part of our continuing mission to provide you with exceptional heart care, we have created designated Provider Care Teams.  These Care Teams include your primary Cardiologist (physician) and Advanced Practice Providers (APPs -  Physician Assistants and Nurse Practitioners) who all work together to provide you with the care you need, when you need it.   Your next appointment:   As needed   Provider:   Orbie Pyo, MD     Other Instructions

## 2023-02-04 NOTE — Telephone Encounter (Signed)
[place] sleep study was ordered in clinic by Dr. Lynnette Caffey. Patient was given device and instructions. Patient is aware to not open and complete the study until we call her with the PIN#. Device has been registered.

## 2023-02-20 NOTE — Telephone Encounter (Signed)
Prior Authorization for [facility] sent to [facility] MEDICAID via web portal. Tracking Number .  READY-APPROVED-NO PA REQ-REF#[id number]

## 2023-02-20 NOTE — Telephone Encounter (Signed)
Pt has been given PIN# [id number] and ok to proceed with sleep study. Pt will do the study one night this week.  Called and made the patient aware that she may proceed with the [facility] Sleep Study. PIN # provided to the patient. Patient made aware that she will be contacted after the test has been read with the results and any recommendations. Patient verbalized understanding and thanked me for the call.

## 2023-03-02 ENCOUNTER — Encounter (INDEPENDENT_AMBULATORY_CARE_PROVIDER_SITE_OTHER): Payer: Medicaid Other | Admitting: Cardiology

## 2023-03-02 DIAGNOSIS — R0683 Snoring: Secondary | ICD-10-CM | POA: Diagnosis not present

## 2023-03-03 NOTE — Procedures (Addendum)
   SLEEP STUDY REPORT Patient Information Study Date: 03/02/2023 Patient Name: Norma Wright Patient ID: [id number] Birth Date: August 27, 2001 Age: 22 Gender: Female BMI: 38.8 (W=247 lb, H=5' 7'') Referring Physician: Alverda Skeans, MD  TEST DESCRIPTION: Home sleep apnea testing was completed using the WatchPat, a Type 1 device, utilizing peripheral arterial tonometry (PAT), chest movement, actigraphy, pulse oximetry, pulse rate, body position and snore. AHI was calculated with apnea and hypopnea using valid sleep time as the denominator. RDI includes apneas, hypopneas, and RERAs. The data acquired and the scoring of sleep and all associated events were performed in accordance with the recommended standards and specifications as outlined in the AASM Manual for the Scoring of Sleep and Associated Events 2.2.0 (2015).   FINDINGS: 1.  No evidence of Obstructive Sleep Apnea with AHI 0/hr.  2.  No Central Sleep Apnea. 3.  Oxygen desaturations as low as 93%. 4.  Mild snoring was present. O2 sats were < 88% for 0 minutes. 5.  Total sleep time was 5 hrs and 20 min. 6.  29.9% of total sleep time was spent in REM sleep.  7.  Normal sleep onset latency at 18 min.  8.  Shortened REM sleep onset latency at 61 min.  9.  Total awakenings were 4.   DIAGNOSIS:  Normal study with no significant sleep disordered breathing.  RECOMMENDATIONS:   1. Normal study with no significant sleep disordered breathing.  2.  Healthy sleep recommendations include:  adequate nightly sleep (normal 7-9 hrs/night), avoidance of caffeine after noon and alcohol near bedtime, and maintaining a sleep environment that is cool, dark and quiet.  3.  Weight loss for overweight patients is recommended.    4.  Snoring recommendations include:  weight loss where appropriate, side sleeping, and avoidance of alcohol before bed.  5.  Operation of motor vehicle or dangerous equipment must be avoided when feeling drowsy, excessively  sleepy, or mentally fatigued.    6.  An ENT consultation which may be useful for specific causes of and possible treatment of bothersome snoring.   7. Weight loss may be of benefit in reducing the severity of snoring.   Signature: Armanda Magic, MD; [facility]; Diplomat, American Board of Sleep Medicine Electronically Signed: 03/03/2023 12:17:13 PM

## 2023-03-04 ENCOUNTER — Ambulatory Visit: Payer: Medicaid Other | Attending: Internal Medicine

## 2023-03-04 ENCOUNTER — Telehealth: Payer: Self-pay

## 2023-03-04 DIAGNOSIS — R002 Palpitations: Secondary | ICD-10-CM

## 2023-03-04 DIAGNOSIS — Z6838 Body mass index (BMI) 38.0-38.9, adult: Secondary | ICD-10-CM

## 2023-03-04 DIAGNOSIS — R0683 Snoring: Secondary | ICD-10-CM

## 2023-03-04 NOTE — Telephone Encounter (Signed)
Patient notified via VM, per DPR, of sleep study results. Staff left callback number for any questions.

## 2023-03-04 NOTE — Telephone Encounter (Signed)
-----   Message from Quintella Reichert, MD sent at 03/03/2023 12:18 PM EDT ----- Please let patient know that sleep study showed no significant sleep apnea.

## 2023-03-29 ENCOUNTER — Telehealth: Payer: Self-pay

## 2023-03-29 NOTE — Telephone Encounter (Signed)
LVM for patient to call back [phone], or to call PCP office to schedule follow up apt. AS, CMA  

## 2023-04-19 ENCOUNTER — Ambulatory Visit
Admission: RE | Admit: 2023-04-19 | Discharge: 2023-04-19 | Disposition: A | Discharge status: Home / Self Care | Payer: Medicaid Other | Source: Ambulatory Visit

## 2023-04-19 VITALS — BP 120/78 | HR 86 | Temp 99.4°F | Resp 16

## 2023-04-19 DIAGNOSIS — N898 Other specified noninflammatory disorders of vagina: Secondary | ICD-10-CM | POA: Diagnosis not present

## 2023-04-19 DIAGNOSIS — R350 Frequency of micturition: Secondary | ICD-10-CM | POA: Diagnosis not present

## 2023-04-19 DIAGNOSIS — Z3202 Encounter for pregnancy test, result negative: Secondary | ICD-10-CM | POA: Insufficient documentation

## 2023-04-19 LAB — POCT URINALYSIS DIP (MANUAL ENTRY)
Bilirubin, UA: NEGATIVE
Blood, UA: NEGATIVE
Glucose, UA: NEGATIVE mg/dL
Ketones, POC UA: NEGATIVE mg/dL
Leukocytes, UA: NEGATIVE
Nitrite, UA: NEGATIVE
Protein Ur, POC: NEGATIVE mg/dL
Spec Grav, UA: >=1.03 — AB (ref 1.010–1.025)
Urobilinogen, UA: 0.2 E.U./dL
pH, UA: 5.5 (ref 5.0–8.0)

## 2023-04-19 LAB — POCT URINE PREGNANCY: Preg Test, Ur: NEGATIVE

## 2023-04-19 NOTE — ED Provider Notes (Signed)
[facility]    CSN: [id number] Arrival date & time: 04/19/23  1306      History   Chief Complaint Chief Complaint  Patient presents with   Urinary Frequency    Entered by patient    HPI Norma Wright is a 22 y.o. female.   Patient presents to urgent care for evaluation of urinary frequency, urgency, and dysuria intermittently for the last 2 weeks. She woke up this morning and noticed that she had wet the bed overnight last night. Dysuria mostly happens if she does not drink enough water. Drinks coffee 3 times per week, not everyday. Drinks diet coke daily but states she has recently cut back on this significantly and drinks 1 20 ounce bottle of diet coke per day. Drinks diet coke mostly around dinner time before going to sleep. She has also been drinking cranberry juice to clear some of her urinary symptoms over the last couple of weeks. No hematuria, nausea, vomiting, diarrhea, abdominal pain, or low back pain.  Reports white/clear vaginal discharge that started a few days ago, no associated vaginal itching, odor, or rash. She is not sexually active, no concern for pregnancy or STD. Has not tried treatment of symptoms PTA.  History of fibromyalgia, takes gabapentin daily.    Urinary Frequency    Past Medical History:  Diagnosis Date   Anemia    Heart murmur     Patient Active Problem List   Diagnosis Date Noted   Physical deconditioning 07/25/2022   Intermittent palpitations 07/18/2022   Chest pain 07/18/2022   DOE (dyspnea on exertion) 07/18/2022   Dysmenorrhea 07/18/2022   Excessive hair growth 07/18/2022   Obesity (BMI 30-39.9) 07/18/2022   Autoimmune disorder (HCC) 07/18/2022   Hot flashes 06/27/2022   Abnormal bruising 06/27/2022   Anxiety 06/27/2022   Positive ANA (antinuclear antibody) 06/19/2022   Sedimentation rate elevation 06/05/2022   Polyarthralgia 06/05/2022   Constipation 03/08/2022   LLQ pain 03/08/2022   Cystic acne 07/31/2021    Influenza vaccination declined 08/23/2017    History reviewed. No pertinent surgical history.  OB History     Gravida  0   Para  0   Term  0   Preterm  0   AB  0   Living  0      SAB  0   IAB  0   Ectopic  0   Multiple  0   Live Births  0            Home Medications    Prior to Admission medications   Medication Sig Start Date End Date Taking? Authorizing Provider  Ferrous Sulfate (IRON PO) Take by mouth.    [provider]  gabapentin (NEURONTIN) 300 MG capsule Take by mouth. 11/23/22 11/23/23  [provider]    Family History Family History  Problem Relation Age of Onset   Heart Problems Mother    Asthma Sister    Asthma Maternal Uncle    Kidney disease Maternal Grandfather     Social History Social History   Tobacco Use   Smoking status: Never    Passive exposure: Never   Smokeless tobacco: Never  Vaping Use   Vaping status: Never Used  Substance Use Topics   Alcohol use: No   Drug use: Never     Allergies   Patient has no known allergies.   Review of Systems Review of Systems  Genitourinary:  Positive for frequency.  Per HPI   Physical  Exam Triage Vital Signs ED Triage Vitals  Encounter Vitals Group     BP 04/19/23 1322 120/78     Systolic BP Percentile --      Diastolic BP Percentile --      Pulse Rate 04/19/23 1322 86     Resp 04/19/23 1322 16     Temp 04/19/23 1322 99.4 F (37.4 C)     Temp Source 04/19/23 1322 Oral     SpO2 04/19/23 1322 97 %     Weight --      Height --      Head Circumference --      Peak Flow --      Pain Score 04/19/23 1325 0     Pain Loc --      Pain Education --      Exclude from Growth Chart --    No data found.  Updated Vital Signs BP 120/78 (BP Location: Left Arm)   Pulse 86   Temp 99.4 F (37.4 C) (Oral)   Resp 16   LMP 03/25/2023 (Approximate)   SpO2 97%   Visual Acuity Right Eye Distance:   Left Eye Distance:   Bilateral Distance:    Right Eye Near:    Left Eye Near:    Bilateral Near:     Physical Exam Vitals and nursing note reviewed.  Constitutional:      Appearance: She is not ill-appearing or toxic-appearing.  HENT:     Head: Normocephalic and atraumatic.     Right Ear: Hearing and external ear normal.     Left Ear: Hearing and external ear normal.     Nose: Nose normal.     Mouth/Throat:     Lips: Pink.     Mouth: Mucous membranes are moist. No injury.     Tongue: No lesions. Tongue does not deviate from midline.     Palate: No mass and lesions.     Pharynx: Oropharynx is clear. Uvula midline. No pharyngeal swelling, oropharyngeal exudate, posterior oropharyngeal erythema or uvula swelling.     Tonsils: No tonsillar exudate or tonsillar abscesses.  Eyes:     General: Lids are normal. Vision grossly intact. Gaze aligned appropriately.     Extraocular Movements: Extraocular movements intact.     Conjunctiva/sclera: Conjunctivae normal.  Cardiovascular:     Rate and Rhythm: Normal rate and regular rhythm.     Heart sounds: Normal heart sounds, S1 normal and S2 normal.  Pulmonary:     Effort: Pulmonary effort is normal. No respiratory distress.     Breath sounds: Normal breath sounds and air entry.  Musculoskeletal:     Cervical back: Neck supple.  Skin:    General: Skin is warm and dry.     Capillary Refill: Capillary refill takes less than 2 seconds.     Findings: No rash.  Neurological:     General: No focal deficit present.     Mental Status: She is alert and oriented to person, place, and time. Mental status is at baseline.     Cranial Nerves: No dysarthria or facial asymmetry.  Psychiatric:        Mood and Affect: Mood normal.        Speech: Speech normal.        Behavior: Behavior normal.        Thought Content: Thought content normal.        Judgment: Judgment normal.      UC Treatments / Results  Labs (all labs  ordered are listed, but only abnormal results are displayed) Labs Reviewed  POCT  URINALYSIS DIP (MANUAL ENTRY) - Abnormal; Notable for the following components:      Result Value   Clarity, UA cloudy (*)    Spec Grav, UA >=1.030 (*)    All other components within normal limits  POCT URINE PREGNANCY  CERVICOVAGINAL ANCILLARY ONLY    EKG   Radiology No results found.  Procedures Procedures (including critical care time)  Medications Ordered in UC Medications - No data to display  Initial Impression / Assessment and Plan / UC Course  I have reviewed the triage vital signs and the nursing notes.  Pertinent labs & imaging results that were available during my care of the patient were reviewed by me and considered in my medical decision making (see chart for details).   1.  Urinary frequency Urinary symptoms are likely due to frequent intake of urinary irritants (diet Coke).  Urinalysis is unremarkable for signs of urinary tract infection, however does show dehydration (elevated specific gravity).  Patient does not appear to be clinically dehydrated, vital signs are hemodynamically stable.  Advised to reduce the amount of caffeinated beverages she is drinking and increase water.  Bedwetting episode likely direct result of caffeine intake close to bedtime. PCP follow-up encouraged for ongoing evaluation of symptoms should they fail to improve in the next 7 to 10 days with lifestyle changes outlined above. Urine pregnancy test is negative.  2.  Vaginal discharge Vaginal swab is pending, test results will come back in the next 2 to 3 days and we will call patient if she tests positive for any vaginal infections/treat accordingly at that time.  She is not currently sexually active.   Counseled patient on potential for adverse effects with medications prescribed/recommended today, strict ER and return-to-clinic precautions discussed, patient verbalized understanding.    Final Clinical Impressions(s) / UC Diagnoses   Final diagnoses:  Urinary frequency  Vaginal  discharge  Urine pregnancy test negative     Discharge Instructions      Your urinary symptoms are likely due to intake of what we call "urinary irritants" such as juice/soda/tea.   Reduce intake of diet coke, if you drink diet coke drink in the morning or mid day/lunch.   Drink at least 64 ounces of water per day, drink 2 extra cups of water per day for every caffeinated drink or sugary drinks that you have.  Follow-up with your primary care provider for ongoing evaluation of symptoms.  Your vaginal swab is pending and we will call you with you test positive for any vaginal infections in the next 2 to 3 days. If the swab is negative, you will not hear from [place].  Return to urgent care as needed.  I hope you feel better!     ED Prescriptions   None    PDMP not reviewed this encounter.   Carlisle Beers, [place] 04/19/23 1408

## 2023-04-19 NOTE — Discharge Instructions (Signed)
Your urinary symptoms are likely due to intake of what we call "urinary irritants" such as juice/soda/tea.   Reduce intake of diet coke, if you drink diet coke drink in the morning or mid day/lunch.   Drink at least 64 ounces of water per day, drink 2 extra cups of water per day for every caffeinated drink or sugary drinks that you have.  Follow-up with your primary care provider for ongoing evaluation of symptoms.  Your vaginal swab is pending and we will call you with you test positive for any vaginal infections in the next 2 to 3 days. If the swab is negative, you will not hear from [place].  Return to urgent care as needed.  I hope you feel better!

## 2023-04-19 NOTE — ED Triage Notes (Signed)
Pt reports fatigue, burning when urinating, urinary frequency, white vaginal discharge x 2 weeks. Pt reports sometimes she can't make it to the bathroom, and this morning she urinated in the bed.

## 2023-04-22 ENCOUNTER — Ambulatory Visit (INDEPENDENT_AMBULATORY_CARE_PROVIDER_SITE_OTHER): Payer: Medicaid Other | Admitting: Family Medicine

## 2023-04-22 ENCOUNTER — Encounter: Payer: Self-pay | Admitting: Family Medicine

## 2023-04-22 VITALS — BP 100/62 | HR 74 | Temp 98.2°F | Resp 20 | Ht 67.0 in | Wt 255.0 lb

## 2023-04-22 DIAGNOSIS — M797 Fibromyalgia: Secondary | ICD-10-CM

## 2023-04-22 DIAGNOSIS — R300 Dysuria: Secondary | ICD-10-CM | POA: Diagnosis not present

## 2023-04-22 DIAGNOSIS — N3941 Urge incontinence: Secondary | ICD-10-CM

## 2023-04-22 NOTE — Progress Notes (Signed)
Assessment & Plan:  1-3. Dysuria/Urge incontinence of urine/Fibromyalgia Encouraged patient to wait for her vaginal swab results.  Discussed if they were normal she may have interstitial cystitis correlating with her fibromyalgia.  If her vaginal swab is normal, I will send hydroxyzine for her to trial for interstitial cystitis.  Education was provided on interstitial cystitis   Follow up plan: Return if symptoms worsen or fail to improve.  Norma Boston, MSN, APRN, FNP-C  Subjective:  HPI: Norma Wright is a 22 y.o. female presenting on 04/22/2023 for Follow-up (Urgent care follow up - [facility] on 8/2 for urinary frequency, vaginal discharge and neg preg test. /)  Patient was seen at urgent care 3 days ago with symptoms of urinary frequency, urgency, and dysuria intermittently for the past 2 weeks.  She was also experiencing white/clear vaginal discharge that started a few days prior.  No associated vaginal itching, odor, or rash.  Urine pregnancy test was negative.  Urinalysis negative for infection.  Vaginal swab is pending.  Today patient reports it is still hard to make it to the bathroom and she is leaking urine.  She rarely drinks a Diet Coke as she states caffeine worsens her fibromyalgia.  She is drinking 420 ounce bottles of water per day.   ROS: Negative unless specifically indicated above in HPI.   Relevant past medical history reviewed and updated as indicated.   Allergies and medications reviewed and updated.   Current Outpatient Medications:    gabapentin (NEURONTIN) 300 MG capsule, Take by mouth., Disp: , Rfl:   No Known Allergies  Objective:   BP 100/62   Pulse 74   Temp 98.2 F (36.8 C)   Resp 20   Ht 5\' 7"  (1.702 m)   Wt 255 lb (115.7 kg)   LMP 04/22/2023 (Exact Date)   BMI 39.94 kg/m    Physical Exam Vitals reviewed.  Constitutional:      General: She is not in acute distress.    Appearance: Normal appearance. She is not ill-appearing,  toxic-appearing or diaphoretic.  HENT:     Head: Normocephalic and atraumatic.  Eyes:     General: No scleral icterus.       Right eye: No discharge.        Left eye: No discharge.     Conjunctiva/sclera: Conjunctivae normal.  Cardiovascular:     Rate and Rhythm: Normal rate.  Pulmonary:     Effort: Pulmonary effort is normal. No respiratory distress.  Abdominal:     General: Bowel sounds are normal. There is no distension or abdominal bruit. There are no signs of injury.     Palpations: Abdomen is soft. There is no hepatomegaly, splenomegaly, mass or pulsatile mass.     Tenderness: There is no abdominal tenderness.  Musculoskeletal:        General: Normal range of motion.     Cervical back: Normal range of motion.  Skin:    General: Skin is warm and dry.     Capillary Refill: Capillary refill takes less than 2 seconds.  Neurological:     General: No focal deficit present.     Mental Status: She is alert and oriented to person, place, and time. Mental status is at baseline.  Psychiatric:        Mood and Affect: Mood normal.        Behavior: Behavior normal.        Thought Content: Thought content normal.        Judgment:  Judgment normal.

## 2023-04-23 ENCOUNTER — Telehealth: Payer: Self-pay

## 2023-04-23 ENCOUNTER — Telehealth: Payer: Self-pay | Admitting: Emergency Medicine

## 2023-04-23 ENCOUNTER — Encounter: Payer: Self-pay | Admitting: Family Medicine

## 2023-04-23 MED ORDER — METRONIDAZOLE 500 MG PO TABS: 500.0000 mg | ORAL_TABLET | Freq: Two times a day (BID) | ORAL | 0 refills | Status: DC

## 2023-04-23 NOTE — Telephone Encounter (Signed)
Please advise for patient as MD is out of office

## 2023-04-23 NOTE — Telephone Encounter (Signed)
Metronidazole for BV

## 2023-04-23 NOTE — Telephone Encounter (Signed)
Pt called requesting to discuss abnormal lab results. Discussed with pt about treatment and abnormal result for BV. Pt request medication sent to [facility] on [address]. Pt expressed understanding to all the things that were discussed .

## 2023-05-17 ENCOUNTER — Ambulatory Visit (INDEPENDENT_AMBULATORY_CARE_PROVIDER_SITE_OTHER): Payer: Medicaid Other | Admitting: Family Medicine

## 2023-05-17 ENCOUNTER — Encounter: Payer: Self-pay | Admitting: Family Medicine

## 2023-05-17 VITALS — BP 116/62 | HR 75 | Temp 97.6°F | Ht 67.0 in | Wt 261.0 lb

## 2023-05-17 DIAGNOSIS — R300 Dysuria: Secondary | ICD-10-CM

## 2023-05-17 DIAGNOSIS — R350 Frequency of micturition: Secondary | ICD-10-CM | POA: Insufficient documentation

## 2023-05-17 DIAGNOSIS — G8929 Other chronic pain: Secondary | ICD-10-CM

## 2023-05-17 DIAGNOSIS — R102 Pelvic and perineal pain: Secondary | ICD-10-CM | POA: Diagnosis not present

## 2023-05-17 LAB — POC URINALSYSI DIPSTICK (AUTOMATED)
Bilirubin, UA: NEGATIVE
Blood, UA: NEGATIVE
Glucose, UA: NEGATIVE
Ketones, UA: NEGATIVE
Leukocytes, UA: NEGATIVE
Nitrite, UA: NEGATIVE
Protein, UA: POSITIVE — AB
Spec Grav, UA: 1.02 (ref 1.010–1.025)
Urobilinogen, UA: 0.2 E.U./dL
pH, UA: 6 (ref 5.0–8.0)

## 2023-05-17 NOTE — Patient Instructions (Addendum)
You can try over the counter Azo  I also recommend starting a probiotic such as Align, Culturelle, Digestive Health, etc.   Avoid caffeine and citrus drinks, alcohol, spicy foods.   You should hear from the Urogynecology office to schedule a visit.

## 2023-05-17 NOTE — Progress Notes (Signed)
Subjective:     Patient ID: Norma Wright, female    DOB: 2001-04-27, 22 y.o.   MRN: [id number]  Chief Complaint  Patient presents with   Pelvic Pain    Has been going on for a year, started to get bad mostly last year. Went to UC last week and was told she had BV and it was treated but still having pain.     HPI  Discussed the use of AI scribe software for clinical note transcription with the patient, who gave verbal consent to proceed.  History of Present Illness         C/o frequent urination and dysuria.   She also has chronic pelvic pain, unexplained.   Recently treated for BV.   Denies being sexually active.      Health Maintenance Due  Topic Date Due   HPV VACCINES (1 - 3-dose series) Never done   DTaP/Tdap/Td (1 - Tdap) Never done    Past Medical History:  Diagnosis Date   Anemia    Heart murmur     History reviewed. No pertinent surgical history.  Family History  Problem Relation Age of Onset   Heart Problems Mother    Asthma Sister    Asthma Maternal Uncle    Kidney disease Maternal Grandfather     Social History   Socioeconomic History   Marital status: Single    Spouse name: Not on file   Number of children: Not on file   Years of education: Not on file   Highest education level: Not on file  Occupational History   Not on file  Tobacco Use   Smoking status: Never    Passive exposure: Never   Smokeless tobacco: Never  Vaping Use   Vaping status: Never Used  Substance and Sexual Activity   Alcohol use: No   Drug use: Never   Sexual activity: Never    Partners: Male    Comment: menarche 14yo, never sexually active  Other Topics Concern   Not on file  Social History Narrative   Not on file   Social Determinants of Health   Financial Resource Strain: Not on file  Food Insecurity: Not on file  Transportation Needs: Not on file  Physical Activity: Not on file  Stress: Not on file  Social Connections: Not on file  Intimate  Partner Violence: Not on file    Outpatient Medications Prior to Visit  Medication Sig Dispense Refill   gabapentin (NEURONTIN) 300 MG capsule Take by mouth.     metroNIDAZOLE (FLAGYL) 500 MG tablet Take 1 tablet (500 mg total) by mouth 2 (two) times daily. (Patient not taking: Reported on 05/17/2023) 14 tablet 0   No facility-administered medications prior to visit.    No Known Allergies  Review of Systems  Constitutional:  Negative for chills and fever.  Respiratory:  Negative for shortness of breath.   Cardiovascular:  Negative for chest pain.  Gastrointestinal:  Negative for abdominal pain, constipation, diarrhea, nausea and vomiting.  Genitourinary:  Positive for dysuria, frequency and urgency.  Neurological:  Negative for dizziness and focal weakness.       Objective:    Physical Exam Constitutional:      General: She is not in acute distress.    Appearance: She is not ill-appearing.  Eyes:     Extraocular Movements: Extraocular movements intact.     Conjunctiva/sclera: Conjunctivae normal.  Cardiovascular:     Rate and Rhythm: Normal rate.  Pulmonary:  Effort: Pulmonary effort is normal.  Musculoskeletal:     Cervical back: Normal range of motion and neck supple.  Skin:    General: Skin is warm and dry.  Neurological:     General: No focal deficit present.     Mental Status: She is alert and oriented to person, place, and time.  Psychiatric:        Mood and Affect: Mood normal.        Behavior: Behavior normal.        Thought Content: Thought content normal.       BP 116/62 (BP Location: Left Arm, Patient Position: Sitting, Cuff Size: Large)   Pulse 75   Temp 97.6 F (36.4 C) (Temporal)   Ht 5\' 7"  (1.702 m)   Wt 261 lb (118.4 kg)   LMP 04/22/2023 (Exact Date)   SpO2 99%   BMI 40.88 kg/m  Wt Readings from Last 3 Encounters:  05/17/23 261 lb (118.4 kg) (>99%, Z= 2.61)*  04/22/23 255 lb (115.7 kg) (>99%, Z= 2.56)*  02/04/23 246 lb 6.4 oz (111.8  kg) (>99%, Z= 2.48)*   * Growth percentiles are based on CDC (Girls, 2-20 Years) data.       Assessment & Plan:   Problem List Items Addressed This Visit       Other   Chronic female pelvic pain - Primary   Relevant Orders   Ambulatory referral to Urogynecology   Dysuria   Relevant Orders   POCT Urinalysis Dipstick (Automated) (Completed)   Ambulatory referral to Urogynecology   Urinary frequency   Relevant Orders   POCT Urinalysis Dipstick (Automated) (Completed)   Ambulatory referral to Urogynecology   Treated for BV 3 weeks ago. Ongoing pelvic pain and urinary symptoms in setting of fibromyalgia. Diagnosed by her rheumatologist.  Referral to urogynecologist for further evaluation and treatment.   I have discontinued Norma Wright's metroNIDAZOLE. I am also having her maintain her gabapentin.  No orders of the defined types were placed in this encounter.

## 2023-05-22 ENCOUNTER — Encounter: Payer: Medicaid Other | Admitting: Family Medicine

## 2023-05-27 ENCOUNTER — Ambulatory Visit (INDEPENDENT_AMBULATORY_CARE_PROVIDER_SITE_OTHER): Payer: Medicaid Other | Admitting: Obstetrics

## 2023-05-27 ENCOUNTER — Encounter: Payer: Self-pay | Admitting: Obstetrics

## 2023-05-27 VITALS — BP 114/73 | HR 87 | Ht 66.75 in | Wt 255.0 lb

## 2023-05-27 DIAGNOSIS — N94819 Vulvodynia, unspecified: Secondary | ICD-10-CM

## 2023-05-27 DIAGNOSIS — N926 Irregular menstruation, unspecified: Secondary | ICD-10-CM

## 2023-05-27 DIAGNOSIS — R102 Pelvic and perineal pain: Secondary | ICD-10-CM

## 2023-05-27 DIAGNOSIS — R350 Frequency of micturition: Secondary | ICD-10-CM

## 2023-05-27 DIAGNOSIS — R1032 Left lower quadrant pain: Secondary | ICD-10-CM | POA: Diagnosis not present

## 2023-05-27 DIAGNOSIS — G8929 Other chronic pain: Secondary | ICD-10-CM

## 2023-05-27 DIAGNOSIS — R300 Dysuria: Secondary | ICD-10-CM | POA: Diagnosis not present

## 2023-05-27 LAB — POCT URINALYSIS DIPSTICK
Bilirubin, UA: NEGATIVE
Blood, UA: NEGATIVE
Glucose, UA: NEGATIVE
Leukocytes, UA: NEGATIVE
Nitrite, UA: NEGATIVE
Protein, UA: NEGATIVE
Spec Grav, UA: >=1.03 — AB (ref 1.010–1.025)
Urobilinogen, UA: 0.2 U/dL
pH, UA: 6 (ref 5.0–8.0)

## 2023-05-27 MED ORDER — PHENAZOPYRIDINE HCL 200 MG PO TABS: 200.0000 mg | ORAL_TABLET | Freq: Three times a day (TID) | ORAL | 1 refills | Status: DC | PRN

## 2023-05-27 MED ORDER — LIDOCAINE 5 % EX OINT: 1.0000 | TOPICAL_OINTMENT | CUTANEOUS | 0 refills | Status: DC | PRN

## 2023-05-27 NOTE — Progress Notes (Signed)
[facility] New Patient Evaluation and Consultation  Referring Provider: Avanell Shackleton, NP-C PCP: Avanell Shackleton, NP-C Date of Service: 05/27/2023  SUBJECTIVE Chief Complaint: New Patient (Initial Visit) Norma Wright is a 22 y.o. female here for a consult for pelvic pain.)  History of Present Illness: Norma Wright is a 22 y.o. Black or African-American female seen in consultation at the request of Dr. Suezanne Jacquet for evaluation of urinary frequency, dysuria, and pelvic pain.   Urinary symptoms started this year. Reports around 10lb weight gain and hair growth on chin this year.  Started probiotics without relief Uses Azo for dysuria with some relief. Avoids caffeine since it worsens fibromyalgia symptoms.  Presented to Urgent care 04/19/23 after wetting the bed overnight in the setting of urinary frequency, urgency, and intermittent dysuria with white/clear vaginal discharge for 2 weeks. Treated for BV with resolution of dysuria, Nuswab positive. UA cloudy, negative for leuk/heme/protein/nit. Reports burning with urination when she decreases oral intake. 05/17/23 repeat UA + protein only Prior delay in care due to grandparents getting sick and mother was in a coma when she was under 20yo. Prior GYN Dr. Jonny Ruiz, reports ultrasound in the past without pelvic pain. Denies STIs in the past.   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  Pad use: 2-3 pads per day without sensation noted on Azo Patient is bothered by UI symptoms.  Day time voids 8.  Nocturia: 2 times per night to void since 2024 Voiding dysfunction:  does not empty bladder well.  Patient does not use a catheter to empty bladder.  When urinating, patient feels a weak stream and dribbling after finishing Drinks: around 80oz water per day, < 20oz diet coke  UTIs:  0  UTI's in the last year.   Denies history of blood in urine, kidney or bladder stones, pyelonephritis, bladder cancer, and kidney cancer  Pelvic Organ Prolapse Symptoms:                  Patient Denies a feeling of a bulge the vaginal area.   Bowel Symptom: Bowel movements: 1 time(s) per day Stool consistency: soft  Straining: no.  Splinting: no.  Incomplete evacuation: no.  Patient Denies accidental bowel leakage / fecal incontinence Bowel regimen: none Last colonoscopy: none  Sexual Function Sexually active: no.  Sexual orientation: Straight Pain with sex: No Reports with external self stimulation   Pelvic Pain Admits to pelvic pain starting 2023 mostly 4/10 vaginal and rectal sharp pain with pressure, diagnosed with fibromyalgia last year. Denies relief with Ibuprofen or Tylenol Worsens during menstrual cycle Reports fibromyalgia flare in legs and arms, managed with gabapentin 300mg   Location: generalized Pain occurs: burning mostly at night time, also occurs during daytime Prior pain treatment: expectant management until it resolves Improved by: nothing Worsened by: moving and sitting in the car  Past Medical History:  Past Medical History:  Diagnosis Date   Anemia    Fibromyalgia    Heart murmur      Past Surgical History:  History reviewed. No pertinent surgical history.   Past OB/GYN History: G 0  P 0 Contraception: none.  LMP 05/19/23 Not due for pap smear.     Medications: Patient has a current medication list which includes the following prescription(s): gabapentin, lidocaine, and phenazopyridine.   Allergies: Patient has No Known Allergies.   Social History:  Social History   Tobacco Use   Smoking status: Never    Passive exposure: Never   Smokeless tobacco: Never  Vaping Use   Vaping status: Never Used  Substance Use Topics   Alcohol use: No   Drug use: Never    Relationship status: single Patient lives with her family.   Patient is employed as a Clinical cytogeneticist. Regular exercise: Yes: walk once a week History of abuse: No  Family History:   Family History  Problem Relation Age of Onset   Heart Problems Mother    Asthma Sister    Asthma Maternal Uncle    Kidney disease Maternal Grandfather      Review of Systems: Review of Systems  Constitutional:  Negative for fever, malaise/fatigue and weight loss.  Respiratory:  Positive for shortness of breath. Negative for cough and wheezing.   Cardiovascular:  Positive for chest pain and palpitations. Negative for leg swelling.  Gastrointestinal:  Positive for abdominal pain. Negative for blood in stool.  Genitourinary:  Positive for dysuria. Negative for hematuria.       Vagina discharge, abnormal menses  Skin:  Negative for rash.  Neurological:  Positive for dizziness and headaches. Negative for weakness.  Endo/Heme/Allergies:  Bruises/bleeds easily.  Psychiatric/Behavioral:  Negative for depression. The patient is nervous/anxious.      OBJECTIVE Physical Exam: Vitals:   05/27/23 1314  BP: 114/73  Pulse: 87  Weight: 255 lb (115.7 kg)  Height: 5' 6.75" (1.695 m)    Physical Exam Vitals reviewed. Exam conducted with a chaperone present.  Constitutional:      General: She is not in acute distress. Cardiovascular:     Rate and Rhythm: Normal rate.  Pulmonary:     Effort: Pulmonary effort is normal. No respiratory distress.   Abdominal:     General: There is no distension.     Palpations: Abdomen is soft.     Tenderness: There is abdominal tenderness in the suprapubic area and left lower quadrant.     Hernia: No hernia is present.    Genitourinary:    Labia:        Right: Tenderness present. No rash, lesion or injury.        Left: Tenderness present. No rash, lesion or injury.      Urethra: No prolapse, urethral pain, urethral swelling or urethral lesion.     Vagina: No signs of injury and foreign body. Tenderness present. No vaginal discharge, erythema, bleeding, lesions or prolapsed vaginal walls.     Uterus: Normal.      Adnexa:        Right: No mass, tenderness or fullness.         Left: No mass, tenderness or fullness.         Comments: Unable to tolerate speculum exam, did not visualize cervix Pain with palpation of bladder Neurological:     Mental Status: She is alert.      GU / Detailed Urogynecologic Evaluation:  Pelvic Exam: Normal external female genitalia; Bartholin's and Skene's glands normal in appearance; urethral meatus normal in appearance, no urethral masses or discharge.   CST: negative  Reflexes: bulbocavernosis not present, anocutaneous not present bilaterally.  Pelvic floor strength V/V, puborectalis V/V   Pelvic floor musculature: Right levator tender, Right obturator tender, Left levator tender, Left obturator tender   Post-Void Residual (PVR) by Bladder Scan: In order to evaluate bladder emptying, we discussed obtaining a postvoid residual and patient agreed to this procedure.  Procedure: The ultrasound unit was placed on the patient's abdomen in the suprapubic region after the patient had voided. A PVR of 44 ml was obtained by bladder scan.  Laboratory Results: @[facility] @   I visualized the urine specimen, noting the specimen to be clear yellow. Positive for ketones only  ASSESSMENT AND PLAN Norma Wright is a 22 y.o. with:  1. Urinary frequency   2. Dysuria   3.  Chronic female pelvic pain   4. Abdominal wall pain in left lower quadrant   5. Irregular menstrual cycle   6. Vulvodynia    Urinary frequency - We discussed the symptoms of overactive bladder (OAB), which include urinary urgency, urinary frequency, nocturia, with or without urge incontinence.  While we do not know the exact etiology of OAB, several treatment options exist. We discussed management including behavioral therapy (decreasing bladder irritants, urge suppression strategies, timed voids, bladder retraining), physical therapy, medication; for refractory cases posterior tibial nerve stimulation, sacral neuromodulation, and intravesical botulinum toxin injection.  For anticholinergic medications, we discussed the potential side effects of anticholinergics including dry eyes, dry mouth, constipation, cognitive impairment and urinary retention. Will avoid due to history of constipation.  - continue to avoid caffeine - referral to pelvic floor PT - UA negative for UTI, patient to return if change in urinary symptoms for repeat testing  Dysuria - For irritative bladder we reviewed treatment options including altering her diet to avoid irritative beverages and foods as well as attempting to decrease stress and other exacerbating factors.  We also discussed using pyridium and similar over-the-counter medications for pain relief as needed. We discussed the pentad of medications including Elmiron, Tums, an antihistamine such as Vistaril, amitriptyline, and L-arginine.  We also discussed in-office bladder instillations for pain flares, as well as cystoscopy with hydrodistention in the operating room, which can be both diagnostic and therapeutic. She was also given information on the IC Network at https://www.ic-network.com for bladder diet suggestions and patient forums for support. - continue to avoid caffeine - referral to pelvic floor PT - UA negative for UTI and bladder pain on exam with palpation,  patient to return if change in urinary symptoms for repeat testing - continue gabapentin for fibromyalgia - encouraged stress management and association with pain flare - continue pyridium PRN symptoms, Rx sent to pharmacy - encouraged to consider bladder instillation if inadequate relief with PFPT  Chronic female pelvic pain - reproducible bilateral pelvic floor myofascial pain on exam - continue gabapentin for fibromyalgia managed by rheumatology - encouraged to consider Cymbalta for neuropathic pain - referral for pelvic floor PT, advised against Kegel's until pain improves  - Start scheduled Ibuprofen and Tylenol at onset of cycle to reassess pain  Abdominal wall pain LLQ - reproducible LLQ pain with + Carnett's sign in the setting of fibromyalgia, pain alleviated by lifting abdominal tissue - discussed ilioinguinal nerve pain and encouraged abdominal wall stretching, topical medication, and weight loss  - discussed possible trigger point injections if refractory  symptoms  Irregular menstrual cycle - reports pain flares with cycles, weight gain and abnormal hair growth - referral to Dr. Karma Greaser to establish GYN care and review options for contraception and possible PCOS workup   Vulvodynia - handout provided for vulvodynia, reviewed diagnosis and treatments - Rx topical lidocaine for 0.5g up to 3 times a day, use prior to PFPT - avoid self stimulation until pain improves   Loleta Chance, MD  Time spent: I spent 70 minutes dedicated to the care of this patient on the date of this encounter to include pre-visit review of records, face-to-face time with the patient discussing pelvic floor myofascial pain, abdominal wall muscle pain in the setting of fibromyalgia, possible bladder pain syndrome treatment and post visit documentation, referral to GYN to establish care, and ordering medication/ testing.

## 2023-05-27 NOTE — Patient Instructions (Signed)
We have sent a referral for pelvic floor PT at [facility], you should receive a call regarding an appointment.   Start topical lidocaine 0.5g up to 3 times a day as needed for comfort.   We have sent a referral to Dr. Karma Greaser to establish care to review contraception and possible PCOS workup.   Continue Azo use as needed up to 3 times a day for flare of urinary symptoms.   Start Ibuprofen 600mg  and Tylenol 500mg  up to 3 times a day as needed for pain flares.   Continue Gabapentin for fibromyalgia.

## 2023-06-07 ENCOUNTER — Other Ambulatory Visit: Payer: Self-pay

## 2023-06-07 ENCOUNTER — Emergency Department (HOSPITAL_COMMUNITY): Payer: Medicaid Other

## 2023-06-07 ENCOUNTER — Emergency Department (HOSPITAL_COMMUNITY)
Admission: EM | Admit: 2023-06-07 | Discharge: 2023-06-08 | Disposition: A | Discharge status: Home / Self Care | Payer: Medicaid Other | Attending: Emergency Medicine | Admitting: Emergency Medicine

## 2023-06-07 ENCOUNTER — Encounter (HOSPITAL_COMMUNITY): Payer: Self-pay

## 2023-06-07 DIAGNOSIS — M25552 Pain in left hip: Secondary | ICD-10-CM | POA: Diagnosis present

## 2023-06-07 DIAGNOSIS — D649 Anemia, unspecified: Secondary | ICD-10-CM | POA: Insufficient documentation

## 2023-06-07 DIAGNOSIS — Y92410 Unspecified street and highway as the place of occurrence of the external cause: Secondary | ICD-10-CM | POA: Insufficient documentation

## 2023-06-07 DIAGNOSIS — R519 Headache, unspecified: Secondary | ICD-10-CM | POA: Diagnosis not present

## 2023-06-07 DIAGNOSIS — R1030 Lower abdominal pain, unspecified: Secondary | ICD-10-CM | POA: Diagnosis not present

## 2023-06-07 HISTORY — DX: Female pelvic inflammatory disease, unspecified: N73.9

## 2023-06-07 LAB — PREGNANCY, URINE: Preg Test, Ur: NEGATIVE

## 2023-06-07 MED ORDER — IOHEXOL 300 MG/ML  SOLN
100.0000 mL | Freq: Once | INTRAMUSCULAR | Status: AC | PRN
  Administered 2023-06-08: 100 mL via INTRAVENOUS
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
## 2023-06-07 NOTE — ED Notes (Signed)
Several  IV attempts were made. Will contact IV team
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
## 2023-06-08 ENCOUNTER — Emergency Department (HOSPITAL_COMMUNITY): Payer: Medicaid Other

## 2023-06-08 LAB — COMPREHENSIVE METABOLIC PANEL
ALT: 19 U/L (ref 0–44)
AST: 16 U/L (ref 15–41)
Albumin: 3.9 g/dL (ref 3.5–5.0)
Alkaline Phosphatase: 54 U/L (ref 38–126)
Anion gap: 6 (ref 5–15)
BUN: 10 mg/dL (ref 6–20)
CO2: 26 mmol/L (ref 22–32)
Calcium: 8.9 mg/dL (ref 8.9–10.3)
Chloride: 104 mmol/L (ref 98–111)
Creatinine, Ser: 0.74 mg/dL (ref 0.44–1.00)
GFR, Estimated: >60 mL/min (ref >60)
Glucose, Bld: 95 mg/dL (ref 70–99)
Potassium: 3.8 mmol/L (ref 3.5–5.1)
Sodium: 136 mmol/L (ref 135–145)
Total Bilirubin: 0.5 mg/dL (ref 0.3–1.2)
Total Protein: 7.2 g/dL (ref 6.5–8.1)

## 2023-06-08 LAB — CBC WITH DIFFERENTIAL/PLATELET
Abs Immature Granulocytes: 0.04 10*3/uL (ref 0.00–0.07)
Basophils Absolute: 0 10*3/uL (ref 0.0–0.1)
Basophils Relative: 0 %
Eosinophils Absolute: 0.1 10*3/uL (ref 0.0–0.5)
Eosinophils Relative: 1 %
HCT: 36.5 % (ref 36.0–46.0)
Hemoglobin: 11.6 g/dL — ABNORMAL LOW (ref 12.0–15.0)
Immature Granulocytes: 0 %
Lymphocytes Relative: 22 %
Lymphs Abs: 2.1 10*3/uL (ref 0.7–4.0)
MCH: 29.1 pg (ref 26.0–34.0)
MCHC: 31.8 g/dL (ref 30.0–36.0)
MCV: 91.7 fL (ref 80.0–100.0)
Monocytes Absolute: 0.8 10*3/uL (ref 0.1–1.0)
Monocytes Relative: 9 %
Neutro Abs: 6.2 10*3/uL (ref 1.7–7.7)
Neutrophils Relative %: 68 %
Platelets: 282 10*3/uL (ref 150–400)
RBC: 3.98 MIL/uL (ref 3.87–5.11)
RDW: 13 % (ref 11.5–15.5)
WBC: 9.2 10*3/uL (ref 4.0–10.5)
nRBC: 0 % (ref 0.0–0.2)

## 2023-06-08 MED ORDER — CYCLOBENZAPRINE HCL 10 MG PO TABS: 10.0000 mg | ORAL_TABLET | Freq: Two times a day (BID) | ORAL | 0 refills | Status: DC | PRN
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
## 2023-06-08 NOTE — Discharge Instructions (Signed)
Lab workup imaging are reassuring, likely you have some muscular pain, this should resolve on its own, may use over-the-counter pain medication as needed, have given you a muscle relaxer take as prescribed, can make you drowsy do not consume alcohol or operate heavy machinery or take this medication.  Follow-up with your PCP as needed  Come back to the emergency department if you develop chest pain, shortness of breath, severe abdominal pain, uncontrolled nausea, vomiting, diarrhea.

## 2023-06-10 ENCOUNTER — Encounter: Payer: Self-pay | Admitting: Obstetrics

## 2023-06-10 ENCOUNTER — Encounter: Payer: Self-pay | Admitting: Physical Therapy

## 2023-06-10 ENCOUNTER — Ambulatory Visit: Payer: Medicaid Other | Attending: Obstetrics | Admitting: Physical Therapy

## 2023-06-10 ENCOUNTER — Other Ambulatory Visit: Payer: Self-pay

## 2023-06-10 DIAGNOSIS — R300 Dysuria: Secondary | ICD-10-CM | POA: Diagnosis not present

## 2023-06-10 DIAGNOSIS — R350 Frequency of micturition: Secondary | ICD-10-CM | POA: Insufficient documentation

## 2023-06-10 DIAGNOSIS — N94819 Vulvodynia, unspecified: Secondary | ICD-10-CM | POA: Insufficient documentation

## 2023-06-10 DIAGNOSIS — R1032 Left lower quadrant pain: Secondary | ICD-10-CM | POA: Insufficient documentation

## 2023-06-10 DIAGNOSIS — M62838 Other muscle spasm: Secondary | ICD-10-CM | POA: Diagnosis not present

## 2023-06-10 DIAGNOSIS — G8929 Other chronic pain: Secondary | ICD-10-CM | POA: Diagnosis not present

## 2023-06-10 DIAGNOSIS — R102 Pelvic and perineal pain: Secondary | ICD-10-CM

## 2023-06-11 ENCOUNTER — Other Ambulatory Visit: Payer: Self-pay | Admitting: Obstetrics

## 2023-06-11 DIAGNOSIS — N946 Dysmenorrhea, unspecified: Secondary | ICD-10-CM

## 2023-06-11 DIAGNOSIS — L689 Hypertrichosis, unspecified: Secondary | ICD-10-CM

## 2023-06-11 NOTE — Progress Notes (Signed)
Resent referral to [facility] to establish care and possible PCOS workup.

## 2023-06-13 ENCOUNTER — Ambulatory Visit: Payer: Medicaid Other

## 2023-06-13 ENCOUNTER — Encounter: Payer: Self-pay | Admitting: Internal Medicine

## 2023-06-13 ENCOUNTER — Ambulatory Visit: Payer: Medicaid Other | Admitting: Internal Medicine

## 2023-06-13 VITALS — BP 114/78 | HR 82 | Temp 98.9°F | Ht 67.5 in | Wt 265.4 lb

## 2023-06-13 DIAGNOSIS — S301XXD Contusion of abdominal wall, subsequent encounter: Secondary | ICD-10-CM | POA: Diagnosis not present

## 2023-06-13 DIAGNOSIS — S060X1D Concussion with loss of consciousness of 30 minutes or less, subsequent encounter: Secondary | ICD-10-CM | POA: Diagnosis not present

## 2023-06-13 DIAGNOSIS — S060XAA Concussion with loss of consciousness status unknown, initial encounter: Secondary | ICD-10-CM | POA: Insufficient documentation

## 2023-06-13 DIAGNOSIS — S301XXA Contusion of abdominal wall, initial encounter: Secondary | ICD-10-CM | POA: Insufficient documentation

## 2023-06-13 MED ORDER — ONDANSETRON HCL 4 MG PO TABS: 4.0000 mg | ORAL_TABLET | Freq: Three times a day (TID) | ORAL | 0 refills | Status: DC | PRN

## 2023-06-13 MED ORDER — NAPROXEN 500 MG PO TABS: 500.0000 mg | ORAL_TABLET | Freq: Two times a day (BID) | ORAL | 0 refills | Status: DC | PRN
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
## 2023-06-13 NOTE — Assessment & Plan Note (Signed)
Warm compress.
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
## 2023-06-14 ENCOUNTER — Ambulatory Visit: Payer: Medicaid Other | Admitting: Nurse Practitioner

## 2023-06-14 ENCOUNTER — Ambulatory Visit (INDEPENDENT_AMBULATORY_CARE_PROVIDER_SITE_OTHER): Payer: Medicaid Other | Admitting: Sports Medicine

## 2023-06-14 VITALS — BP 120/80 | HR 80 | Ht 67.0 in | Wt 264.0 lb

## 2023-06-14 DIAGNOSIS — G44319 Acute post-traumatic headache, not intractable: Secondary | ICD-10-CM

## 2023-06-14 DIAGNOSIS — S060X9A Concussion with loss of consciousness of unspecified duration, initial encounter: Secondary | ICD-10-CM

## 2023-06-14 NOTE — Patient Instructions (Addendum)
Work and school note  1 week follow up  Recommendations:  -           Relative mental and physical rest for 48 hours after concussive event -Recommend light aerobic activity while keeping symptoms less than 3/10 -Stop mental or physical activities that cause symptoms to worsen greater than 3/10, and wait 24 hours before attempting them again -Eliminate screen time as much as possible for first 48 hours after concussive event, then continue limited screen time (recommend less than 2 hours per day)
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
## 2023-06-17 ENCOUNTER — Encounter: Payer: Self-pay | Admitting: Physical Therapy

## 2023-06-17 ENCOUNTER — Ambulatory Visit: Payer: Medicaid Other | Admitting: Physical Therapy

## 2023-06-17 DIAGNOSIS — R350 Frequency of micturition: Secondary | ICD-10-CM | POA: Diagnosis not present

## 2023-06-17 DIAGNOSIS — R278 Other lack of coordination: Secondary | ICD-10-CM

## 2023-06-17 DIAGNOSIS — R102 Pelvic and perineal pain: Secondary | ICD-10-CM

## 2023-06-17 DIAGNOSIS — M62838 Other muscle spasm: Secondary | ICD-10-CM
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
## 2023-06-19 ENCOUNTER — Ambulatory Visit (INDEPENDENT_AMBULATORY_CARE_PROVIDER_SITE_OTHER): Payer: Medicaid Other | Admitting: Sports Medicine

## 2023-06-19 VITALS — HR 72 | Ht 67.0 in | Wt 264.0 lb

## 2023-06-19 DIAGNOSIS — G44319 Acute post-traumatic headache, not intractable: Secondary | ICD-10-CM | POA: Diagnosis not present

## 2023-06-19 DIAGNOSIS — G4700 Insomnia, unspecified: Secondary | ICD-10-CM

## 2023-06-19 DIAGNOSIS — S060X9A Concussion with loss of consciousness of unspecified duration, initial encounter: Secondary | ICD-10-CM

## 2023-06-19 MED ORDER — TRAZODONE HCL 50 MG PO TABS: 50.0000 mg | ORAL_TABLET | Freq: Every day | ORAL | 0 refills | Status: DC

## 2023-06-19 NOTE — Patient Instructions (Signed)
Melatonin 5 mg nightly  Trazodone 50 mg nightly  Goal of 7-8 hours of sleep per night Work note provided out of work and school  1 week follow up

## 2023-06-21 ENCOUNTER — Ambulatory Visit (INDEPENDENT_AMBULATORY_CARE_PROVIDER_SITE_OTHER): Payer: Medicaid Other | Admitting: Family Medicine

## 2023-06-21 VITALS — BP 102/68 | HR 72 | Temp 97.8°F | Ht 67.0 in | Wt 262.0 lb

## 2023-06-21 DIAGNOSIS — M797 Fibromyalgia: Secondary | ICD-10-CM

## 2023-06-21 DIAGNOSIS — S060X1D Concussion with loss of consciousness of 30 minutes or less, subsequent encounter: Secondary | ICD-10-CM

## 2023-06-21 DIAGNOSIS — M2550 Pain in unspecified joint: Secondary | ICD-10-CM
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
## 2023-06-21 NOTE — Patient Instructions (Signed)
You should hear from [facility]   You will also here from a pain management specialist.   Let me know if you have not heard in 2 weeks.    Follow up with sports medicine as scheduled.

## 2023-06-24 ENCOUNTER — Ambulatory Visit: Payer: Medicaid Other | Attending: Obstetrics | Admitting: Physical Therapy

## 2023-06-24 DIAGNOSIS — M797 Fibromyalgia: Secondary | ICD-10-CM | POA: Insufficient documentation

## 2023-06-24 DIAGNOSIS — M62838 Other muscle spasm: Secondary | ICD-10-CM | POA: Insufficient documentation

## 2023-06-24 DIAGNOSIS — R102 Pelvic and perineal pain: Secondary | ICD-10-CM | POA: Diagnosis present

## 2023-06-24 DIAGNOSIS — R278 Other lack of coordination: Secondary | ICD-10-CM | POA: Diagnosis present
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
## 2023-06-26 ENCOUNTER — Ambulatory Visit: Payer: Medicaid Other | Admitting: Sports Medicine

## 2023-06-26 VITALS — BP 108/82 | HR 84 | Ht 67.0 in | Wt 260.0 lb

## 2023-06-26 DIAGNOSIS — G4700 Insomnia, unspecified: Secondary | ICD-10-CM | POA: Diagnosis not present

## 2023-06-26 DIAGNOSIS — M542 Cervicalgia: Secondary | ICD-10-CM

## 2023-06-26 DIAGNOSIS — R270 Ataxia, unspecified: Secondary | ICD-10-CM

## 2023-06-26 DIAGNOSIS — G44319 Acute post-traumatic headache, not intractable: Secondary | ICD-10-CM | POA: Diagnosis not present

## 2023-06-26 DIAGNOSIS — S060X9A Concussion with loss of consciousness of unspecified duration, initial encounter: Secondary | ICD-10-CM

## 2023-06-26 NOTE — Patient Instructions (Signed)
Work and school note provided Recommend gradually increasing trazodone until you are able to sleep 7-8 hours per night Increase dose by 50 mg every 2 days until you sleep for 7-8 hours Maximum dose of 300 mg daily (6 50 mg tablets) Call use and as for a refill based on the effective dose PT referral  3 week follow up

## 2023-06-28 ENCOUNTER — Telehealth: Payer: Self-pay | Admitting: Family Medicine

## 2023-06-28 ENCOUNTER — Other Ambulatory Visit: Payer: Self-pay | Admitting: Internal Medicine

## 2023-06-28 MED ORDER — ONDANSETRON HCL 4 MG PO TABS: 4.0000 mg | ORAL_TABLET | Freq: Three times a day (TID) | ORAL | 1 refills | Status: DC | PRN

## 2023-06-28 NOTE — Telephone Encounter (Signed)
Prescription Request  06/28/2023  LOV: 06/21/2023  What is the name of the medication or equipment?  ondansetron (ZOFRAN) 4 MG tablet   Have you contacted your pharmacy to request a refill? Yes   Which pharmacy would you like this sent to?  [id number] [place], [place] - [address] [place] [address] Phone: [phone] Fax: [phone]    Patient notified that their request is being sent to the clinical staff for review and that they should receive a response within 2 business days.   Please advise at Mobile [phone] (mobile)

## 2023-06-28 NOTE — Telephone Encounter (Signed)
Rx sent 

## 2023-07-01 ENCOUNTER — Ambulatory Visit: Payer: Medicaid Other | Admitting: Physical Therapy

## 2023-07-01 DIAGNOSIS — M62838 Other muscle spasm: Secondary | ICD-10-CM

## 2023-07-01 DIAGNOSIS — R102 Pelvic and perineal pain: Secondary | ICD-10-CM

## 2023-07-01 DIAGNOSIS — R278 Other lack of coordination: Secondary | ICD-10-CM
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
## 2023-07-01 NOTE — Patient Instructions (Signed)
Dilators: 1. Find a comfortable position and work on diaphragmatic breathing: you can lie on your back propped up on pillows and knees supported as a good first position to try 2. Start with your finger and a little bit of lubricant: gentle massage the vaginal entrance before use of dilator 3. Wash dilators before and after you use them; then place a little bit of lubricant along the dilator on all sides 4. Gently press the very tip of the dilator into the vagina - stop with any pain or discomfort 5. First goal is to get dilator inside vagina: you can use the back and forth (side to side) movement to help gently press dilator forward. 6. Once the dilator is inside the vaginal canal, just lie there and take some deep breaths, read something, listen to music, etc - distract yourself! 7. Ways to progress: a. Bigger side to side b. Tilting c. Twisting d. Thrusting - does not have to be the length of the dilator at first - start with just a small movement 8. If you get stuck and there's pain: leave the dilator where it is and try to ignore it - distract yourself and work on breathing. If you cannot get a dilator comfortable, go down in size. The primary goal is to make this a positive experience and end on a good note.

## 2023-07-03 ENCOUNTER — Telehealth: Payer: Self-pay | Admitting: Sports Medicine
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
## 2023-07-03 NOTE — Telephone Encounter (Signed)
Patient's mother called stating that they were advised if she needed an increase in dose on her traZODone, to let us know. She was given 50mg  and is asking for 150mg .

## 2023-07-04 ENCOUNTER — Other Ambulatory Visit: Payer: Self-pay | Admitting: Sports Medicine

## 2023-07-04 ENCOUNTER — Encounter: Payer: Self-pay | Admitting: Physical Medicine and Rehabilitation

## 2023-07-04 MED ORDER — TRAZODONE HCL 150 MG PO TABS: 150.0000 mg | ORAL_TABLET | Freq: Every day | ORAL | 0 refills | Status: DC

## 2023-07-04 NOTE — Progress Notes (Unsigned)
Refill placed

## 2023-07-16 ENCOUNTER — Telehealth: Payer: Self-pay | Admitting: Family Medicine

## 2023-07-16 NOTE — Telephone Encounter (Signed)
Patient's mother called and said patient's other providers are saying she needs to lose more weight. She has already been referred to a weight loss clinic, but she has been told she hasn't lost enough weight. They would like to know what else PCP would recommend to help. Best callback for patient is [phone]. Best callback for Victorino Dike is [phone].

## 2023-07-17 ENCOUNTER — Telehealth: Payer: Self-pay | Admitting: Family Medicine

## 2023-07-17 ENCOUNTER — Ambulatory Visit (INDEPENDENT_AMBULATORY_CARE_PROVIDER_SITE_OTHER): Payer: Medicaid Other | Admitting: Sports Medicine

## 2023-07-17 VITALS — BP 110/78 | Ht 67.0 in | Wt 264.0 lb

## 2023-07-17 DIAGNOSIS — S060X9A Concussion with loss of consciousness of unspecified duration, initial encounter: Secondary | ICD-10-CM | POA: Diagnosis not present

## 2023-07-17 DIAGNOSIS — G4700 Insomnia, unspecified: Secondary | ICD-10-CM

## 2023-07-17 DIAGNOSIS — G44319 Acute post-traumatic headache, not intractable: Secondary | ICD-10-CM

## 2023-07-17 DIAGNOSIS — R270 Ataxia, unspecified: Secondary | ICD-10-CM

## 2023-07-17 DIAGNOSIS — M542 Cervicalgia: Secondary | ICD-10-CM

## 2023-07-17 NOTE — Telephone Encounter (Signed)
Pt is wanting to transfer her care from [facility] to [facility]. I spoke with pt after she had made herself an appt through mychart. She says we are closer to her home. Let me know and I'll give her a call. [phone]

## 2023-07-17 NOTE — Telephone Encounter (Signed)
LM for pt to call and schedule appt. °

## 2023-07-17 NOTE — Patient Instructions (Signed)
Work and school notes provided Continue trazodone 150 mg nightly call if you need a refill  Melatonin 5 mg nightly  Continue vestibular therapy  3 week follow up
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
## 2023-07-22 ENCOUNTER — Encounter
Payer: Medicaid Other | Attending: Physical Medicine and Rehabilitation | Admitting: Physical Medicine and Rehabilitation

## 2023-07-22 ENCOUNTER — Encounter: Payer: Self-pay | Admitting: Physical Medicine and Rehabilitation

## 2023-07-22 VITALS — Ht 67.0 in | Wt 259.0 lb

## 2023-07-22 DIAGNOSIS — M542 Cervicalgia: Secondary | ICD-10-CM | POA: Insufficient documentation

## 2023-07-22 DIAGNOSIS — M2550 Pain in unspecified joint: Secondary | ICD-10-CM | POA: Insufficient documentation

## 2023-07-22 DIAGNOSIS — R519 Headache, unspecified: Secondary | ICD-10-CM | POA: Diagnosis present

## 2023-07-22 DIAGNOSIS — M797 Fibromyalgia: Secondary | ICD-10-CM | POA: Diagnosis not present

## 2023-07-22 DIAGNOSIS — G894 Chronic pain syndrome: Secondary | ICD-10-CM | POA: Diagnosis not present

## 2023-07-22 DIAGNOSIS — F4321 Adjustment disorder with depressed mood: Secondary | ICD-10-CM | POA: Insufficient documentation

## 2023-07-22 MED ORDER — DULOXETINE HCL 30 MG PO CPEP: ORAL_CAPSULE | ORAL | 3 refills | Status: DC

## 2023-07-22 NOTE — Patient Instructions (Signed)
Start Cymbalta 30 mg daily.  You will take 1 capsule in the morning, and reduce your nighttime trazodone to 100 mg for 1 week.  After 1 week, you will increase to 2 capsules or 60 mg of Cymbalta daily, and reduce your trazodone to 50 mg nightly.  You can continue to use this dose as needed as long as you are not experiencing side effects.  Message me through MyChart in 2 weeks to let me know how this medication adjustment is going.  Given that she prior had side effects with gabapentin, we discussed starting Lyrica or gabapentin at a lower dose today, however we will hold off on this given other medication adjustments at this time.  I agree with continuing physical therapy, and encourage you to work on adding aerobic exercise to your regimen with use of a pool to offload her joints.  I will coordinate with [place] [facility] regarding medication adjustments as above and progress to your goal of returning to work.  I agree with evaluation by OB/GYN for PCOS, as this can certainly affect your energy levels, pain control, and weight gain.  Follow-up with me at next available appointment for trigger point injections to your neck and shoulders.  Schedule general follow-up with me in 2 to 3 months.

## 2023-07-23 ENCOUNTER — Encounter: Payer: Self-pay | Admitting: Physical Medicine and Rehabilitation

## 2023-07-30 ENCOUNTER — Encounter (INDEPENDENT_AMBULATORY_CARE_PROVIDER_SITE_OTHER): Payer: Medicaid Other | Admitting: Physician Assistant

## 2023-08-01 ENCOUNTER — Encounter: Payer: Self-pay | Admitting: Nurse Practitioner

## 2023-08-01 ENCOUNTER — Other Ambulatory Visit: Payer: Self-pay | Admitting: Nurse Practitioner

## 2023-08-01 ENCOUNTER — Ambulatory Visit: Payer: Medicaid Other | Admitting: Nurse Practitioner

## 2023-08-01 VITALS — BP 118/72 | HR 84 | Temp 97.6°F | Ht 67.0 in | Wt 257.6 lb

## 2023-08-01 DIAGNOSIS — S060X1D Concussion with loss of consciousness of 30 minutes or less, subsequent encounter: Secondary | ICD-10-CM | POA: Diagnosis not present

## 2023-08-01 DIAGNOSIS — N3010 Interstitial cystitis (chronic) without hematuria: Secondary | ICD-10-CM | POA: Diagnosis not present

## 2023-08-01 DIAGNOSIS — M797 Fibromyalgia: Secondary | ICD-10-CM

## 2023-08-01 MED ORDER — CONTRAVE 8-90 MG PO TB12: ORAL_TABLET | ORAL | 0 refills | Status: DC

## 2023-08-01 NOTE — Patient Instructions (Signed)
It was great to see you!  Start contrave 1 tablet daily for 7 days, then 1 tablet twice a day.  Ask your mom when your last tetanus was, if you had the HPV vaccines or who you saw in [place].   Let's follow-up in 6-8 weeks, sooner if you have concerns.   Take care,  Rodman Pickle, NP

## 2023-08-01 NOTE — Assessment & Plan Note (Signed)
Managed by a rheumatologist and physical medicine and rehabilitation, she experiences flare-ups affecting work and exercise. She will continue the current management plan, including trigger point injections and duloxetine 30mg  daily.
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
## 2023-08-01 NOTE — Assessment & Plan Note (Signed)
She is experiencing difficulty losing weight despite a calorie deficit and regular walking, with fibromyalgia and potential PCOS possibly contributing. Specialists have advised weight loss to manage interstitial cystitis symptoms. We will start Contrave for weight loss, initially one pill in the morning for seven days, then increasing to twice a day. She should continue her current diet and exercise regimen. We will check insurance coverage for Contrave and Wegovy, switching to [facility] if [name] is not covered.
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
## 2023-08-01 NOTE — Assessment & Plan Note (Signed)
She has frequent flare-ups causing urinary symptoms, with weight loss advised to help manage these symptoms. She will continue the current management plan, including bladder control medication and physical therapy.

## 2023-08-05 ENCOUNTER — Other Ambulatory Visit: Payer: Self-pay | Admitting: Nurse Practitioner

## 2023-08-05 ENCOUNTER — Other Ambulatory Visit (HOSPITAL_COMMUNITY): Payer: Self-pay

## 2023-08-05 ENCOUNTER — Encounter: Payer: Self-pay | Admitting: Physical Medicine and Rehabilitation

## 2023-08-05 ENCOUNTER — Encounter: Payer: Medicaid Other | Admitting: Physical Medicine and Rehabilitation

## 2023-08-05 VITALS — BP 132/76 | HR 76 | Ht 67.0 in | Wt 256.0 lb

## 2023-08-05 DIAGNOSIS — M542 Cervicalgia: Secondary | ICD-10-CM | POA: Insufficient documentation

## 2023-08-05 DIAGNOSIS — G894 Chronic pain syndrome: Secondary | ICD-10-CM

## 2023-08-05 DIAGNOSIS — M797 Fibromyalgia: Secondary | ICD-10-CM

## 2023-08-05 MED ORDER — WEGOVY 0.25 MG/0.5ML ~~LOC~~ SOAJ: 0.2500 mg | SUBCUTANEOUS | 1 refills | Status: DC

## 2023-08-05 MED ORDER — TRIAMCINOLONE ACETONIDE 40 MG/ML IJ SUSP
5.0000 mg | Freq: Once | INTRAMUSCULAR | Status: AC
  Administered 2023-08-05: 5.2 mg via INTRAMUSCULAR

## 2023-08-05 MED ORDER — LIDOCAINE HCL 1 % IJ SOLN
6.0000 mL | Freq: Once | INTRAMUSCULAR | Status: AC
  Administered 2023-08-05: 6 mL

## 2023-08-05 NOTE — Progress Notes (Signed)
HPI:   Norma Wright is a 22 y.o. female with PMHx has Cystic acne; Constipation; LLQ pain; Sedimentation rate elevation; Polyarthralgia; Positive ANA (antinuclear antibody); Hot flashes; Abnormal bruising; Anxiety; Intermittent palpitations; Chest pain; DOE (dyspnea on exertion); Dysmenorrhea; Excessive hair growth; Physical deconditioning; Chronic female pelvic pain; Dysuria; Concussion; Fibromyalgia; Severe obesity (BMI >= 40) (HCC); Chronic pain syndrome; Bilateral headaches; Adjustment disorder with depressed mood; Interstitial cystitis; and Myofascial neck pain on their problem list. who presents to clinic for treatment of pain related to chronic headaches, neck pain, and firbomyalgia  via injection as described below.    No new concerns or complaints. No major changes in medical history since last visit.   Physical Exam:  General: Appropriate appearance for age.  Mental Status: Appropriate mood and affect.  Cardiovascular: RRR, no m/r/g.  Respiratory: CTAB, no rales/rhonchi/wheezing.  Skin: No apparent rashes or lesions.  Neuro: Awake, alert, and oriented x3. No apparent deficits.  MSK: Moving all 4 limbs antigravity and against resistance.  + TTP right cervical paraspinal, bilateral trapezius, bilateral levator scapulae, bilateral thoracic paraspinals and left infraspinatus muscles  PROCEDURE: Trigger point injections Diagnosis: M54.2, M79.7 Goals with treatment: [x ] Decrease pain [x]  Improve Active / Passive ROM [  ] Improve ADLs [  ] Improve functional mobility  MEDICATION:  Kenalog 40 mg/mL - 0.2 ccs Lidocaine 1% - 6 ccs   CONSENT: Obtained in writing followed by time-out per clinic policy. Consent uploaded to chart.  Benefits discussed.  Risks discussed included, but were not limited to, pain and discomfort, bleeding, bruising, allergic reaction, infection. All questions answered to patient/family member/guardian/ caregiver satisfaction. They would like to proceed with  procedure. There are no noted contraindications to procedure.  PROCEDURE Time out was preformed No heat sources No antibiotics  The patient was explained about both the benefits and risks of a trigger point injection. After the patient acknowledged an understanding of the risks and benefits, the patient agreed to proceed. The area was first marked and then prepped in an aseptic fashion with betadine / alcohol. A 30 g, 1/2 inch needle was directed via a posterior approach into the right cervical paraspinal, bilateral trapezius, bilateral levator scapulae, bilateral thoracic paraspinals and left infraspinatus muscles. The injection was completed with Kenalog 0.2 cc mixed with 6cc of 1% lidocaine after no blood was aspirated on pull back.  The pt tolerated the procedure well. The patient reported immediate relief of the pain and improved pain free range of motion. No complications were encountered.   Impression: HPI: Flarence Wright is a 22 y.o. female with PMHx has Cystic acne; Constipation; LLQ pain; Sedimentation rate elevation; Polyarthralgia; Positive ANA (antinuclear antibody); Hot flashes; Abnormal bruising; Anxiety; Intermittent palpitations; Chest pain; DOE (dyspnea on exertion); Dysmenorrhea; Excessive hair growth; Physical deconditioning; Chronic female pelvic pain; Dysuria; Concussion; Fibromyalgia; Severe obesity (BMI >= 40) (HCC); Chronic pain syndrome; Bilateral headaches; Adjustment disorder with depressed mood; Interstitial cystitis; and Myofascial neck pain on their problem list. who presents to clinic for treatment of cervical myofascial and fibromyalgia pain . They received a  Bilateral  trigger point injections as above.   PLAN: - Resume Usual Activities. Notify Physician of any unusual bleeding, erythema or concern for side effects as reviewed above. - Apply ice prn for pain - Tylenol prn for pain - Follow up 09/02/23 to assess response to injection   Patient/Care Giver was ready  to learn without apparent learning barriers. Education was provided on diagnosis, treatment options/plan according to patient's preferred learning  style. Patient/Care Giver verbalized understanding and agreement with the above plan.   Angelina Sheriff, DO 08/05/2023

## 2023-08-05 NOTE — Patient Instructions (Signed)
-   Resume Usual Activities. Notify Physician of any unusual bleeding, erythema or concern for side effects as reviewed above. - Apply ice prn for pain - Tylenol prn for pain - Follow up 09/02/23 to assess response to injection

## 2023-08-06 ENCOUNTER — Encounter: Payer: Self-pay | Admitting: Nurse Practitioner

## 2023-08-06 ENCOUNTER — Telehealth: Payer: Self-pay

## 2023-08-06 NOTE — Telephone Encounter (Signed)
PA for [facility] submitted through cover my meds. Awaiting response.   Dm/cma  Key: [id number]

## 2023-08-06 NOTE — Telephone Encounter (Addendum)
I called and spoke with patient and notified of below message.

## 2023-08-06 NOTE — Telephone Encounter (Signed)
Pharmacy Patient Advocate Encounter  Received notification from [employer] that Prior Authorization for Wegovy 0.25mg /0.40ml has been DENIED.  See denial reason below. No denial letter attached in CMM. Will attach denial letter to Media tab once received.   PA #/Case ID/Reference #: [id number]

## 2023-08-07 NOTE — Telephone Encounter (Signed)
Patient notified below message vis mychart

## 2023-08-07 NOTE — Telephone Encounter (Signed)
Noted  

## 2023-08-09 ENCOUNTER — Ambulatory Visit (INDEPENDENT_AMBULATORY_CARE_PROVIDER_SITE_OTHER): Payer: Medicaid Other | Admitting: Radiology

## 2023-08-09 ENCOUNTER — Other Ambulatory Visit: Payer: Self-pay | Admitting: Radiology

## 2023-08-09 ENCOUNTER — Ambulatory Visit: Payer: Medicaid Other | Attending: Obstetrics | Admitting: Physical Therapy

## 2023-08-09 VITALS — BP 110/64 | Wt 260.0 lb

## 2023-08-09 DIAGNOSIS — R278 Other lack of coordination: Secondary | ICD-10-CM | POA: Diagnosis present

## 2023-08-09 DIAGNOSIS — M6289 Other specified disorders of muscle: Secondary | ICD-10-CM

## 2023-08-09 DIAGNOSIS — N946 Dysmenorrhea, unspecified: Secondary | ICD-10-CM

## 2023-08-09 DIAGNOSIS — R102 Pelvic and perineal pain: Secondary | ICD-10-CM | POA: Diagnosis present

## 2023-08-09 DIAGNOSIS — M2550 Pain in unspecified joint: Secondary | ICD-10-CM | POA: Diagnosis not present

## 2023-08-09 DIAGNOSIS — N760 Acute vaginitis: Secondary | ICD-10-CM | POA: Diagnosis not present

## 2023-08-09 DIAGNOSIS — Z30011 Encounter for initial prescription of contraceptive pills: Secondary | ICD-10-CM

## 2023-08-09 DIAGNOSIS — M62838 Other muscle spasm: Secondary | ICD-10-CM | POA: Diagnosis present

## 2023-08-09 DIAGNOSIS — B9689 Other specified bacterial agents as the cause of diseases classified elsewhere: Secondary | ICD-10-CM

## 2023-08-09 DIAGNOSIS — N898 Other specified noninflammatory disorders of vagina: Secondary | ICD-10-CM

## 2023-08-09 LAB — WET PREP FOR TRICH, YEAST, CLUE

## 2023-08-09 MED ORDER — METRONIDAZOLE 500 MG PO TABS: 500.0000 mg | ORAL_TABLET | Freq: Two times a day (BID) | ORAL | 0 refills | Status: DC

## 2023-08-09 MED ORDER — TYBLUME 0.1-20 MG-MCG PO CHEW: 1.0000 | CHEWABLE_TABLET | Freq: Every day | ORAL | 0 refills | Status: DC

## 2023-08-09 NOTE — Progress Notes (Signed)
   Norma Wright [birth year] [id number]   History:  22 y.o. G0 presents with multiple concerns today. Dysmenorrhea since menarche, chronic pelvic pain, diagnosed with interstitial cystitis and BV recently. She c/o of fishy smelling discharge still. Patient started on wegovy for weight loss by PCP. She is being seen by rheumatology who has started her on treatment for fibromyalgia however treatments are not helping, open to seeing another provider.  Gynecologic History Patient's last menstrual period was 07/12/2023 (exact date).     Obstetric History OB History  Gravida Para Term Preterm AB Living  0 0 0 0 0 0  SAB IAB Ectopic Multiple Live Births  0 0 0 0 0    The following portions of the patient's history were reviewed and updated as appropriate: allergies, current medications, past family history, past medical history, past social history, past surgical history, and problem list.  Review of Systems  All other systems reviewed and are negative.   Past medical history, past surgical history, family history and social history were all reviewed and documented in the EPIC chart.  Exam:  Vitals:   08/09/23 1147  BP: 110/64  Weight: 260 lb (117.9 kg)   Body mass index is 40.72 kg/m.  Physical Exam Vitals and nursing note reviewed. Exam conducted with a chaperone present.  Constitutional:      Appearance: Normal appearance. She is well-developed.  Pulmonary:     Effort: Pulmonary effort is normal.  Abdominal:     General: Abdomen is flat.     Palpations: Abdomen is soft.  Genitourinary:    General: Normal vulva.     Vagina: Vaginal discharge present. No erythema, bleeding or lesions.     Uterus: Normal.      Adnexa: Right adnexa normal and left adnexa normal.     Comments: Unable to tolerate speculum fully open, cervix not visualized Neurological:     Mental Status: She is alert.  Psychiatric:        Mood and Affect: Mood normal.        Thought Content: Thought content  normal.        Judgment: Judgment normal.      [name], CMA present for exam  Assessment/Plan:   1. Dysmenorrhea Reassured blood testing and ultrasound from 07/2022 shows no sign of PCOS, no need to repeat either. Prefers chewable. Start with next menses - TYBLUME 0.1-20 MG-MCG CHEW; Chew 1 tablet by mouth daily.  Dispense: 84 tablet; Refill: 0  2. Oral contraception initiation - TYBLUME 0.1-20 MG-MCG CHEW; Chew 1 tablet by mouth daily.  Dispense: 84 tablet; Refill: 0  3. Vaginal discharge - WET PREP FOR TRICH, YEAST, CLUE  4. BV (bacterial vaginosis) - metroNIDAZOLE (FLAGYL) 500 MG tablet; Take 1 tablet (500 mg total) by mouth 2 (two) times daily.  Dispense: 14 tablet; Refill: 0  5. Pain in joint involving multiple sites Patient is not satisfied with her current care at rheum, will refer out for a second option - Ambulatory referral to Rheumatology  6. Pelvic floor dysfunction in female Worsened over the past year. Continue PFPT, dilator use while at home    Return in about 3 months (around 11/09/2023) for Med Follow-up.  Arlie Solomons B WHNP-BC 1:04 PM 08/09/2023
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
## 2023-08-12 NOTE — Telephone Encounter (Signed)
Medication refill request: tyblume 0.1-20mg -mcg chew Last OV:  08-09-23 Next AEX: not scheduled Last MMG (if hormonal medication request): none Refill authorized: rx was sent for #[id number] with 0 refills on 08-09-23. Pharmacy states they need a diagnosis. I added it to this rx to be denied if appropriate

## 2023-08-13 ENCOUNTER — Other Ambulatory Visit: Payer: Self-pay | Admitting: Physical Medicine and Rehabilitation

## 2023-08-14 ENCOUNTER — Ambulatory Visit: Payer: Medicaid Other | Admitting: Sports Medicine

## 2023-08-14 ENCOUNTER — Telehealth: Payer: Self-pay

## 2023-08-14 VITALS — BP 104/72 | Ht 67.0 in | Wt 256.0 lb

## 2023-08-14 DIAGNOSIS — S060X9A Concussion with loss of consciousness of unspecified duration, initial encounter: Secondary | ICD-10-CM | POA: Diagnosis not present

## 2023-08-14 DIAGNOSIS — R270 Ataxia, unspecified: Secondary | ICD-10-CM

## 2023-08-14 DIAGNOSIS — G4700 Insomnia, unspecified: Secondary | ICD-10-CM

## 2023-08-14 DIAGNOSIS — G44319 Acute post-traumatic headache, not intractable: Secondary | ICD-10-CM | POA: Diagnosis not present

## 2023-08-14 NOTE — Patient Instructions (Signed)
  Continue to decrease Trazadone and eventually discontinue Continue PT See me as needed

## 2023-08-14 NOTE — Telephone Encounter (Signed)
LVM for patient to return call.   Per - encounter her Rx of Reginal Lutes was approved.

## 2023-08-19 NOTE — Telephone Encounter (Signed)
I called and spoke with patient and notified her that Reginal Lutes was approved and to call her pharmacy to make sure that it was in stock.

## 2023-08-23 ENCOUNTER — Ambulatory Visit: Payer: Medicaid Other | Attending: Obstetrics | Admitting: Physical Therapy

## 2023-08-23 ENCOUNTER — Encounter: Payer: Self-pay | Admitting: Physical Therapy

## 2023-08-23 DIAGNOSIS — R102 Pelvic and perineal pain: Secondary | ICD-10-CM | POA: Insufficient documentation

## 2023-08-23 DIAGNOSIS — R278 Other lack of coordination: Secondary | ICD-10-CM | POA: Insufficient documentation

## 2023-08-23 DIAGNOSIS — M62838 Other muscle spasm: Secondary | ICD-10-CM | POA: Insufficient documentation

## 2023-08-23 NOTE — Therapy (Signed)
OUTPATIENT PHYSICAL THERAPY FEMALE PELVIC TREATMENT  Patient Name: Norma Wright MRN: [id number] DOB:2001/04/20, 22 y.o., female Today's Date: 08/23/2023  END OF SESSION:  PT End of Session - 08/23/23 0946     Visit Number 6    Number of Visits 12    Date for PT Re-Evaluation 09/02/23    Authorization Type [employer]    Authorization Time Period 12 weeks    Authorization - Number of Visits 12    Progress Note Due on Visit 12    PT Start Time 0930    PT Stop Time 1015    PT Time Calculation (min) 45 min    Activity Tolerance Patient tolerated treatment well    Behavior During Therapy WFL for tasks assessed/performed                  Past Medical History:  Diagnosis Date   Anemia    Fibromyalgia    Heart murmur    Interstitial cystitis    PID (pelvic inflammatory disease)    History reviewed. No pertinent surgical history. Patient Active Problem List   Diagnosis Date Noted   Myofascial neck pain 08/05/2023   Interstitial cystitis 08/01/2023   Chronic pain syndrome 07/22/2023   Bilateral headaches 07/22/2023   Adjustment disorder with depressed mood 07/22/2023   Fibromyalgia 06/24/2023   Severe obesity (BMI >= 40) (HCC) 06/24/2023   Concussion 06/13/2023   Chronic female pelvic pain 05/17/2023   Dysuria 05/17/2023   Physical deconditioning 07/25/2022   Intermittent palpitations 07/18/2022   Chest pain 07/18/2022   DOE (dyspnea on exertion) 07/18/2022   Dysmenorrhea 07/18/2022   Excessive hair growth 07/18/2022   Hot flashes 06/27/2022   Abnormal bruising 06/27/2022   Anxiety 06/27/2022   Positive ANA (antinuclear antibody) 06/19/2022   Sedimentation rate elevation 06/05/2022   Polyarthralgia 06/05/2022   Constipation 03/08/2022   LLQ pain 03/08/2022   Cystic acne 07/31/2021    PCP: Avanell Shackleton, NP-C REFERRING PROVIDER: Loleta Chance, MD  REFERRING DIAG:  1. Urinary frequency  2. Dysuria  3. Chronic female pelvic pain  4. Abdominal wall  pain in left lower quadrant  5. Irregular menstrual cycle  6. Vulvodynia   THERAPY DIAG:  Pelvic pain   Other muscle spasm   Rationale for Evaluation and Treatment: Rehabilitation   ONSET DATE: 2022   SUBJECTIVE:   08/23/2023 Pt reports that she has a little pain d/t BV. She has been using bath and body works soap, has switched to [place].  She has seen a gynecologist, she gave her new birth control for cramps and heavy bleeding, has helped a lot.  Reports that she usually can't go anywhere when she is on her period.                                                             Fluid intake: Yes:      PAIN:  Are you having pain? Yes  NPRS scale: 5/10 lower abdomen Pain location: 9/10 joints and bones- pt reports autoimmune origin of pain vs fibromyalgia  Pain type: sharp, sometimes sore, constant  Pain description: constant and sharp   Aggravating factors: coffee, soda, citrus, spicy foods, marinara sauce, milk - has had to cut those out Relieving factors: lying down   PRECAUTIONS:  None   RED FLAGS:  Bowel or bladder incontinence: yes- bowel, exercises have helped a little bit   WEIGHT BEARING RESTRICTIONS: Yes    FALLS:  Has patient fallen in last 6 months? No   LIVING ENVIRONMENT:  Lives with: lives with their family  Lives in: House/apartment  Stairs: no  Has following equipment at home: None   OCCUPATION: Proofreader, works Health and safety inspector job   PLOF: Independent   PATIENT GOALS: pain reduction   PERTINENT HISTORY:   Sexual abuse: No   BOWEL MOVEMENT:  Pain with bowel movement: sometimes  Type of bowel movement: 3-4 Fully empty rectum: Yes:   Leakage: No  Pads: No  Fiber supplement: No   URINATION:  Pain with urination: Yes takes AZO  Fully empty bladder: No  Stream: Weak sometimes  Urgency: Yes:   Frequency:  every 30 mis at times  Leakage: Urge to void and Walking to the bathroom  Pads: Yes: 3 menstrual pad   INTERCOURSE:  Pain with intercourse:   not sexually active, tried to do [place], had to stop  Ability to have vaginal penetration:  No      OBJECTIVE:  Very tender and tight pelvic floor. Tender to touch externally, pt in a lot of pain, unable to assess deeper layers d/t pain.  Bruising lighter, tight tissue felt in abdominal adipose tissue and left hip. Limited AROM cervical spine with a lot of guarding throughout shoulders and upper body    PATIENT SURVEYS:    PFIQ-7 140  COGNITION:  Overall cognitive status: Within functional limits for tasks assessed   SENSATION:  Light touch: Appears intact  Proprioception: Appears intact    POSTURE: rounded shoulders and forward head    LOWER EXTREMITY ROM:   Active ROM Right  eval Left  eval  Hip flexion  Hip extension  Hip abduction  Hip adduction  Hip internal rotation 80 80  Hip external rotation 80 80  Knee flexion  Knee extension  Ankle dorsiflexion  Ankle plantarflexion  Ankle inversion  Ankle eversion   (Blank rows = not tested)   PALPATION:                  External Perineal Exam very tender to touch                              Internal Pelvic Floor unable to today d/t pain   Patient confirms identification and approves PT to assess internal pelvic floor and treatment No   PELVIC MMT:    MMT eval  Vaginal  Internal Anal Sphincter  External Anal Sphincter  Puborectalis  Diastasis Recti          TONE:  High tone pelvic floor   PROLAPSE:  Unable to assess   TODAY'S TREATMENT:                                                                                08/23/2023      Neuro reed: child's pose butterfly stretch, diaphragmatic breathing, Hamstring stretch,  piriformis stretch,   Therapeutic activities:  Review of constipation strategies, vaginal pH, dilators, education  on lubricants, HEP, PT goals       HOME EXERCISE PROGRAM:                    [id number]    CLINICAL IMPRESSION:  08/23/2023 Pt progressing well with her  exercises, she still has difficulty inserting the smallest dilator, discussed trying a finger instead once the irritation is calmed down. Gave pt samples of lubricants and feminine wash from [employer] and educated her on vaginal pH. Pt with improving constipation- bristol stool scale 3-4, reducing bladder irritants. Still with chronic pain, some hypermobility present throughout hips. Pt with stiffness throughout neck, some guarding post concussion still present. She Has been coming to pelvic PT more consistently, however missed 5 weeks and would benefit from cont to achieve goals.   OBJECTIVE IMPAIRMENTS: decreased coordination, decreased endurance, decreased knowledge of condition, difficulty walking, decreased ROM, increased muscle spasms, obesity, and pain.   ACTIVITY LIMITATIONS: carrying, lifting, bending, standing, and squatting   PARTICIPATION LIMITATIONS: occupation and yard work   PERSONAL FACTORS: Fitness, Time since onset of injury/illness/exacerbation, and 1-2 comorbidities: polymyalgia and IC  are also affecting patient's functional outcome.   REHAB POTENTIAL: Good   CLINICAL DECISION MAKING: Stable/uncomplicated   EVALUATION COMPLEXITY: Low    GOALS:  Goals reviewed with patient? Yes   SHORT TERM GOALS: Target date: 09/06/2023     1. Pt to demonstrate improved coordination of pelvic floor and breathing mechanics with 10# squat with appropriate synergistic patterns to decrease pain and leakage at least 75% of the time.    Baseline:  Goal status: INITIAL   2.  Pt will report decreased pain at vulva to max 5/10 and be I with strategies for self care  Baseline:  Goal status: INITIAL   3.  Pt will be I with HEP and demonstrate  all exercises correctly  Baseline:  Goal status: INITIAL   4.  Pt will lower her PFIQ-7 by at least 15 points  Baseline: 67  Goal status: INITIAL     LONG TERM GOALS: Target date: 11/01/2023      1. Pt will be independent with the  knack, urge suppression technique, and double voiding in order to improve bladder habits and decrease urinary frequency  Baseline:  Goal status: INITIAL   2.  Pt will report max 3/10 pain at perineum and will be I with manual strategies ( abdominal and perineal massage) in order to [facility] improved self care and reduce pain  Baseline:  Goal status: INITIAL   3.  Patient will have reduced her PFIQ-7 to max 30 points  Baseline:  Goal status: INITIAL    PLAN:   PT FREQUENCY: 1x/week   PT DURATION: 8 sessions  PLANNED INTERVENTIONS: Therapeutic exercises, Therapeutic activity, Neuromuscular re-education, Balance training, Gait training, Patient/Family education, Self Care, Joint mobilization, Dry Needling, Electrical stimulation, Spinal manipulation, Spinal mobilization, Moist heat, Compression bandaging, scar mobilization, and Manual therapy   PLAN FOR NEXT SESSION: internal pelvic floor muscle assessment  and treatment, cont down training, bladder diary  Hikaru Delorenzo, PT 08/23/23 10:33 AM

## 2023-08-27 ENCOUNTER — Ambulatory Visit: Payer: Medicaid Other | Admitting: Physical Therapy

## 2023-08-27 ENCOUNTER — Encounter: Payer: Self-pay | Admitting: Physical Therapy

## 2023-08-27 DIAGNOSIS — R102 Pelvic and perineal pain: Secondary | ICD-10-CM

## 2023-08-27 DIAGNOSIS — M62838 Other muscle spasm: Secondary | ICD-10-CM

## 2023-08-27 DIAGNOSIS — R278 Other lack of coordination: Secondary | ICD-10-CM | POA: Diagnosis present

## 2023-08-27 NOTE — Therapy (Signed)
OUTPATIENT PHYSICAL THERAPY FEMALE PELVIC TREATMENT  Patient Name: Norma Wright MRN: [id number] DOB:August 27, 2001, 22 y.o., female Today's Date: 08/27/2023  END OF SESSION:  PT End of Session - 08/27/23 0843     Visit Number 7    Number of Visits 12    Date for PT Re-Evaluation 09/02/23    Authorization Type [employer]    Authorization Time Period 12 weeks    Authorization - Number of Visits 12    Progress Note Due on Visit 12    PT Start Time 0800    PT Stop Time 0843    PT Time Calculation (min) 43 min    Activity Tolerance Patient tolerated treatment well    Behavior During Therapy WFL for tasks assessed/performed                   Past Medical History:  Diagnosis Date   Anemia    Fibromyalgia    Heart murmur    Interstitial cystitis    PID (pelvic inflammatory disease)    History reviewed. No pertinent surgical history. Patient Active Problem List   Diagnosis Date Noted   Myofascial neck pain 08/05/2023   Interstitial cystitis 08/01/2023   Chronic pain syndrome 07/22/2023   Bilateral headaches 07/22/2023   Adjustment disorder with depressed mood 07/22/2023   Fibromyalgia 06/24/2023   Severe obesity (BMI >= 40) (HCC) 06/24/2023   Concussion 06/13/2023   Chronic female pelvic pain 05/17/2023   Dysuria 05/17/2023   Physical deconditioning 07/25/2022   Intermittent palpitations 07/18/2022   Chest pain 07/18/2022   DOE (dyspnea on exertion) 07/18/2022   Dysmenorrhea 07/18/2022   Excessive hair growth 07/18/2022   Hot flashes 06/27/2022   Abnormal bruising 06/27/2022   Anxiety 06/27/2022   Positive ANA (antinuclear antibody) 06/19/2022   Sedimentation rate elevation 06/05/2022   Polyarthralgia 06/05/2022   Constipation 03/08/2022   LLQ pain 03/08/2022   Cystic acne 07/31/2021    PCP: Avanell Shackleton, NP-C REFERRING PROVIDER: Loleta Chance, MD  REFERRING DIAG:  1. Urinary frequency  2. Dysuria  3. Chronic female pelvic pain  4. Abdominal  wall pain in left lower quadrant  5. Irregular menstrual cycle  6. Vulvodynia   THERAPY DIAG:  Pelvic pain   Other muscle spasm   Rationale for Evaluation and Treatment: Rehabilitation   ONSET DATE: 2022   SUBJECTIVE:   08/27/2023 Pt reports that she had bad pelvic pain last night d/t greek yogurt. Maybe coffee.  She has pain 6/10 in her joints. She is interested in trying dry needling.  Irritation is better with Dove.                                                            Fluid intake: Yes:      PAIN:  Are you having pain? Yes  NPRS scale: 5/10 lower abdomen Pain location: 9/10 joints and bones- pt reports autoimmune origin of pain vs fibromyalgia  Pain type: sharp, sometimes sore, constant  Pain description: constant and sharp   Aggravating factors: coffee, soda, citrus, spicy foods, marinara sauce, milk - has had to cut those out Relieving factors: lying down   PRECAUTIONS: None   RED FLAGS:  Bowel or bladder incontinence: yes- bowel, exercises have helped a little bit   WEIGHT BEARING  RESTRICTIONS: Yes    FALLS:  Has patient fallen in last 6 months? No   LIVING ENVIRONMENT:  Lives with: lives with their family  Lives in: House/apartment  Stairs: no  Has following equipment at home: None   OCCUPATION: Proofreader, works Health and safety inspector job   PLOF: Independent   PATIENT GOALS: pain reduction   PERTINENT HISTORY:   Sexual abuse: No   BOWEL MOVEMENT:  Pain with bowel movement: sometimes  Type of bowel movement: 3-4 Fully empty rectum: Yes:   Leakage: No  Pads: No  Fiber supplement: No   URINATION:  Pain with urination: Yes takes AZO  Fully empty bladder: No  Stream: Weak sometimes  Urgency: Yes:   Frequency:  every 30 mis at times  Leakage: Urge to void and Walking to the bathroom  Pads: Yes: 3 menstrual pad   INTERCOURSE:  Pain with intercourse:  not sexually active, tried to do [place], had to stop  Ability to have vaginal penetration:  No       OBJECTIVE:  Very tender and tight pelvic floor. Tender to touch externally, pt in a lot of pain, unable to assess deeper layers d/t pain.  Bruising lighter, tight tissue felt in abdominal adipose tissue and left hip. Limited AROM cervical spine with a lot of guarding throughout shoulders and upper body    PATIENT SURVEYS:    PFIQ-7 140  COGNITION:  Overall cognitive status: Within functional limits for tasks assessed   SENSATION:  Light touch: Appears intact  Proprioception: Appears intact    POSTURE: rounded shoulders and forward head    LOWER EXTREMITY ROM:   Active ROM Right  eval Left  eval  Hip flexion  Hip extension  Hip abduction  Hip adduction  Hip internal rotation 80 80  Hip external rotation 80 80  Knee flexion  Knee extension  Ankle dorsiflexion  Ankle plantarflexion  Ankle inversion  Ankle eversion   (Blank rows = not tested)   PALPATION:                  External Perineal Exam very tender to touch                              Internal Pelvic Floor unable to today d/t pain   Patient confirms identification and approves PT to assess internal pelvic floor and treatment No   PELVIC MMT:    MMT eval  Vaginal  Internal Anal Sphincter  External Anal Sphincter  Puborectalis  Diastasis Recti          TONE:  High tone pelvic floor   PROLAPSE:  Unable to assess   TODAY'S TREATMENT:                                                                                08/27/2023                    bridging                    Rowing green thera band with minisquat  Upper trap stretch               Manual: trial of dry needling and manual STM       HOME EXERCISE PROGRAM:                    [id number]  Trigger Point Dry-Needling  Treatment instructions: Expect mild to moderate muscle soreness. S/S of pneumothorax if dry needled over a lung field, and to seek immediate medical attention should they occur. Patient  verbalized understanding of these instructions and education.  Patient Consent Given: Yes Education handout provided: Yes Muscles treated: bilateral upper traps Treatment response/outcome: Utilized skilled palpation to identify trigger points.  During dry needling able to palpate muscle twitch and muscle elongation   Skilled palpation and monitoring by PT during dry needling   CLINICAL IMPRESSION:  08/23/2023   Pt did well with trial of dry needling and some strengthening exercises- she has trigger points in bilateral UTs and hypermobility throughout her hips and knees. Discussed ending dilator training with climax for improved pain experience.    OBJECTIVE IMPAIRMENTS: decreased coordination, decreased endurance, decreased knowledge of condition, difficulty walking, decreased ROM, increased muscle spasms, obesity, and pain.   ACTIVITY LIMITATIONS: carrying, lifting, bending, standing, and squatting   PARTICIPATION LIMITATIONS: occupation and yard work   PERSONAL FACTORS: Fitness, Time since onset of injury/illness/exacerbation, and 1-2 comorbidities: polymyalgia and IC  are also affecting patient's functional outcome.   REHAB POTENTIAL: Good   CLINICAL DECISION MAKING: Stable/uncomplicated   EVALUATION COMPLEXITY: Low    GOALS:  Goals reviewed with patient? Yes   SHORT TERM GOALS: Target date: 09/06/2023     1. Pt to demonstrate improved coordination of pelvic floor and breathing mechanics with 10# squat with appropriate synergistic patterns to decrease pain and leakage at least 75% of the time.    Baseline:  Goal status: INITIAL   2.  Pt will report decreased pain at vulva to max 5/10 and be I with strategies for self care  Baseline:  Goal status: INITIAL   3.  Pt will be I with HEP and demonstrate  all exercises correctly  Baseline:  Goal status: INITIAL   4.  Pt will lower her PFIQ-7 by at least 15 points  Baseline: 67  Goal status: INITIAL     LONG TERM  GOALS: Target date: 11/01/2023      1. Pt will be independent with the knack, urge suppression technique, and double voiding in order to improve bladder habits and decrease urinary frequency  Baseline:  Goal status: INITIAL   2.  Pt will report max 3/10 pain at perineum and will be I with manual strategies ( abdominal and perineal massage) in order to [facility] improved self care and reduce pain  Baseline:  Goal status: INITIAL   3.  Patient will have reduced her PFIQ-7 to max 30 points  Baseline:  Goal status: INITIAL    PLAN:   PT FREQUENCY: 1x/week   PT DURATION: 8 sessions  PLANNED INTERVENTIONS: Therapeutic exercises, Therapeutic activity, Neuromuscular re-education, Balance training, Gait training, Patient/Family education, Self Care, Joint mobilization, Dry Needling, Electrical stimulation, Spinal manipulation, Spinal mobilization, Moist heat, Compression bandaging, scar mobilization, and Manual therapy   PLAN FOR NEXT SESSION: internal pelvic floor muscle assessment  and treatment, cont down training, bladder diary  Norma Wright, PT 08/27/23 8:43 AM

## 2023-09-02 ENCOUNTER — Encounter
Payer: Medicaid Other | Attending: Physical Medicine and Rehabilitation | Admitting: Physical Medicine and Rehabilitation

## 2023-09-02 VITALS — BP 102/70 | HR 96 | Ht 67.0 in | Wt 245.0 lb

## 2023-09-02 DIAGNOSIS — M797 Fibromyalgia: Secondary | ICD-10-CM

## 2023-09-02 DIAGNOSIS — M7712 Lateral epicondylitis, left elbow: Secondary | ICD-10-CM

## 2023-09-02 DIAGNOSIS — R5383 Other fatigue: Secondary | ICD-10-CM

## 2023-09-02 DIAGNOSIS — M2550 Pain in unspecified joint: Secondary | ICD-10-CM | POA: Diagnosis present

## 2023-09-02 DIAGNOSIS — M542 Cervicalgia: Secondary | ICD-10-CM

## 2023-09-02 DIAGNOSIS — G894 Chronic pain syndrome: Secondary | ICD-10-CM | POA: Diagnosis not present

## 2023-09-02 DIAGNOSIS — R519 Headache, unspecified: Secondary | ICD-10-CM

## 2023-09-02 MED ORDER — DULOXETINE HCL 30 MG PO CPEP: 60.0000 mg | ORAL_CAPSULE | Freq: Two times a day (BID) | ORAL | 5 refills | Status: DC

## 2023-09-02 MED ORDER — DICLOFENAC SODIUM 1 % EX GEL: 4.0000 g | Freq: Four times a day (QID) | CUTANEOUS | Status: DC

## 2023-09-02 NOTE — Patient Instructions (Addendum)
Increase duloxetine from 60 mg daily to 60 mg twice daily.  I would continue this dose of the medication for at least 3 to 4 weeks before knowing if it will be effective.  If you start experiencing side effects or wish to come off the medication, do a gradual taper removing 1 tablet per week to avoid side effects.  Please call clinic if you have any questions or concerns, or message me through MyChart.  I would like to hear from you in 2 weeks regardless to know the effects of the medication.  For neck and shoulder pain, along with pain in your left elbow, get Voltaren gel over-the-counter and apply it to these areas at least twice daily, with a max of 4 times daily.  You can also continue to use over-the-counter Salonpas or Bengay patches for pain.  I am sending you to [facility] therapy for gentle mobilization and to work on improving cardiovascular endurance both for fibromyalgia pain control and to help you with your goal of weight loss.  For your left elbow, you can obtain an over-the-counter brace as shown below to help offload the tendons in your wrist; I would use this alongside activity modification by reducing the number of activities that strain your left wrist for at least 2 weeks.    Follow-up in 3 months.  Finally, I am sending you to sleep medicine for a home sleep study to evaluate for sleep apnea.
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
## 2023-09-03 ENCOUNTER — Ambulatory Visit: Payer: Medicaid Other | Admitting: Physical Therapy

## 2023-09-03 ENCOUNTER — Encounter: Payer: Self-pay | Admitting: Physical Therapy

## 2023-09-03 ENCOUNTER — Telehealth: Payer: Self-pay

## 2023-09-03 DIAGNOSIS — R102 Pelvic and perineal pain: Secondary | ICD-10-CM | POA: Diagnosis not present

## 2023-09-03 DIAGNOSIS — M62838 Other muscle spasm: Secondary | ICD-10-CM
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
## 2023-09-03 NOTE — Telephone Encounter (Signed)
Pt LVM in triage line inquiring about the 2 referrals sent by provider.  For PT Other for reheumatology

## 2023-09-04 MED ORDER — WEGOVY 0.5 MG/0.5ML ~~LOC~~ SOAJ: 0.5000 mg | SUBCUTANEOUS | 1 refills | Status: DC

## 2023-09-04 NOTE — Telephone Encounter (Signed)
Per notes from OV on 08/09/2023-only referral sent by provider was for Rheum w/ Dr. Allena Katz in [place]. Per referral notes: it was faxed on 11/26.  Spoke w/ referral office for rheum and they confirmed that referral was received and they plan to call pt soon for an appt.   However, per notes re: other referrral for PT-pt currently doing PT w/ rehab at [place], no new referral was sent.    LVMTCB

## 2023-09-04 NOTE — Addendum Note (Signed)
Addended by: Rodman Pickle A on: 09/04/2023 11:32 AM   Modules accepted: Orders

## 2023-09-09 ENCOUNTER — Other Ambulatory Visit: Payer: Self-pay | Admitting: Internal Medicine

## 2023-09-16 ENCOUNTER — Ambulatory Visit (INDEPENDENT_AMBULATORY_CARE_PROVIDER_SITE_OTHER): Payer: Medicaid Other | Admitting: Nurse Practitioner

## 2023-09-16 ENCOUNTER — Encounter: Payer: Self-pay | Admitting: Nurse Practitioner

## 2023-09-16 VITALS — BP 102/60 | HR 85 | Temp 97.6°F | Ht 67.0 in | Wt 240.0 lb

## 2023-09-16 DIAGNOSIS — Z3041 Encounter for surveillance of contraceptive pills: Secondary | ICD-10-CM | POA: Diagnosis not present

## 2023-09-16 DIAGNOSIS — R1032 Left lower quadrant pain: Secondary | ICD-10-CM | POA: Diagnosis not present

## 2023-09-16 DIAGNOSIS — N3010 Interstitial cystitis (chronic) without hematuria: Secondary | ICD-10-CM

## 2023-09-16 DIAGNOSIS — E669 Obesity, unspecified: Secondary | ICD-10-CM | POA: Diagnosis not present

## 2023-09-16 NOTE — Assessment & Plan Note (Signed)
She reports prolonged bleeding and mood swings after initiating birth control last month, though symptoms appear to be resolving. Discussed it can take up to 3 months for her cycles to regulate once starting birth control. We will continue the current birth control regimen and consider switching if symptoms persist or worsen.

## 2023-09-16 NOTE — Assessment & Plan Note (Signed)
Improvement in interstitial cystitis symptoms has been noted with weight loss and physical therapy. We will continue the current management plan.

## 2023-09-16 NOTE — Progress Notes (Signed)
Established Patient Office Visit  Subjective   Patient ID: Norma Wright, female    DOB: [birth year]  Age: 22 y.o. MRN: [id number]  Chief Complaint  Patient presents with   Weight Management    Follow up, wants to test for gluten    HPI  Discussed the use of AI scribe software for clinical note transcription with the patient, who gave verbal consent to proceed.  History of Present Illness   The patient, with a history of obesity and interstitial cystitis (IC), presents for follow-up after starting [facility] for weight loss. She reports a significant weight loss of seventeen pounds since the last visit. She has been experiencing nausea, especially in the mornings, which she attributes to overeating. She also suspects a possible gluten allergy, as she often experiences stomach aches and nausea after consuming gluten. She reports improvement in these symptoms when following a gluten-free diet. She denies any issues with dairy. She also reports a history of constipation and diarrhea, especially when she was younger. The abdominal discomfort is described as being in the middle of the stomach and worse after eating, especially gluten.  In addition to the [facility], she is also on a calorie deficit diet, avoiding sugar and salt, and exercising regularly, including walking at least three times a week and using waistbands for workouts. She reports feeling better overall, especially with her IC symptoms, and is continuing physical therapy for the same.  She also reports concerns about her menstrual cycle after starting birth control last month. She experienced an ongoing period with variable flow and blood clots. She also noticed mood swings, which she attributes to the medication. She has just finished her period and is starting a new pack of birth control.        ROS See pertinent positives and negatives per HPI.    Objective:     BP 102/60 (BP Location: Left Arm, Patient Position: Sitting, Cuff  Size: Normal)   Pulse 85   Temp 97.6 F (36.4 C)   Ht 5\' 7"  (1.702 m)   Wt 240 lb (108.9 kg)   LMP 09/09/2023 (Exact Date)   BMI 37.59 kg/m  BP Readings from Last 3 Encounters:  09/16/23 102/60  09/02/23 102/70  08/14/23 104/72   Wt Readings from Last 3 Encounters:  09/16/23 240 lb (108.9 kg)  09/02/23 245 lb (111.1 kg)  08/14/23 256 lb (116.1 kg)      Physical Exam Vitals and nursing note reviewed.  Constitutional:      General: She is not in acute distress.    Appearance: Normal appearance.  HENT:     Head: Normocephalic.  Eyes:     Conjunctiva/sclera: Conjunctivae normal.  Cardiovascular:     Rate and Rhythm: Normal rate and regular rhythm.     Pulses: Normal pulses.     Heart sounds: Normal heart sounds.  Pulmonary:     Effort: Pulmonary effort is normal.     Breath sounds: Normal breath sounds.  Musculoskeletal:     Cervical back: Normal range of motion.  Skin:    General: Skin is warm.  Neurological:     General: No focal deficit present.     Mental Status: She is alert and oriented to person, place, and time.  Psychiatric:        Mood and Affect: Mood normal.        Behavior: Behavior normal.        Thought Content: Thought content normal.  Judgment: Judgment normal.      Assessment & Plan:   Problem List Items Addressed This Visit       Genitourinary   Interstitial cystitis   Improvement in interstitial cystitis symptoms has been noted with weight loss and physical therapy. We will continue the current management plan.         Other   LLQ pain - Primary   She reports stomach aches and nausea following gluten consumption, with noted improvement on a gluten-free diet. There have been no significant issues with other foods. We will order a blood test for gluten and other food allergies.      Relevant Orders   Gliadin antibodies, serum   Tissue transglutaminase, IgA   Reticulin Antibody, IgA w reflex titer   Food Allergy Profile    Obesity (BMI 30-39.9)   Significant weight loss has been achieved through the use of Wegovy 0.5mg  alongside lifestyle modifications including a calorie deficit, reduced sugar and salt intake, regular walking, and resistance band exercises. She reports experiencing nausea, especially in the mornings, which may be related to overeating. We will continue with [facility] 0.5mg  and the current lifestyle modifications. Encouraged her to reach out if she would like to increase the dose before next visit. Follow-up in 3 months.       Oral contraceptive use   She reports prolonged bleeding and mood swings after initiating birth control last month, though symptoms appear to be resolving. Discussed it can take up to 3 months for her cycles to regulate once starting birth control. We will continue the current birth control regimen and consider switching if symptoms persist or worsen.      Return in about 3 months (around 12/15/2023) for weight management .    Gerre Scull, NP

## 2023-09-16 NOTE — Patient Instructions (Signed)
It was great to see you!  We are checking your labs today and will let you know the results via mychart/phone.   Let me know if you want to go up again on your next dose of Wegovy, in about 3 weeks.   Let's follow-up in 3 months, sooner if you have concerns.  If a referral was placed today, you will be contacted for an appointment. Please note that routine referrals can sometimes take up to 3-4 weeks to process. Please call our office if you haven't heard anything after this time frame.  Take care,  Rodman Pickle, NP

## 2023-09-16 NOTE — Assessment & Plan Note (Signed)
She reports stomach aches and nausea following gluten consumption, with noted improvement on a gluten-free diet. There have been no significant issues with other foods. We will order a blood test for gluten and other food allergies.

## 2023-09-16 NOTE — Assessment & Plan Note (Signed)
Significant weight loss has been achieved through the use of Wegovy 0.5mg  alongside lifestyle modifications including a calorie deficit, reduced sugar and salt intake, regular walking, and resistance band exercises. She reports experiencing nausea, especially in the mornings, which may be related to overeating. We will continue with [facility] 0.5mg  and the current lifestyle modifications. Encouraged her to reach out if she would like to increase the dose before next visit. Follow-up in 3 months.

## 2023-09-17 LAB — FOOD ALLERGY PROFILE
Allergen, Salmon, f41: <0.1 kU/L
Almonds: <0.1 kU/L
CLASS: 0
CLASS: 0
CLASS: 0
CLASS: 0
CLASS: 0
CLASS: 0
CLASS: 0
CLASS: 0
CLASS: 0
CLASS: 0
Cashew IgE: <0.1 kU/L
Class: 0
Class: 0
Class: 0
Egg White IgE: <0.1 kU/L
Fish Cod: <0.1 kU/L
Hazelnut: <0.1 kU/L
Milk IgE: <0.1 kU/L
Peanut IgE: <0.1 kU/L
Scallop IgE: 0.1 kU/L — ABNORMAL HIGH
Sesame Seed f10: <0.1 kU/L
Shrimp IgE: 0.28 kU/L — ABNORMAL HIGH
Soybean IgE: <0.1 kU/L
Tuna IgE: <0.1 kU/L
Walnut: <0.1 kU/L
Wheat IgE: <0.1 kU/L

## 2023-09-17 LAB — GLIADIN ANTIBODIES, SERUM
Gliadin IgA: 20.2 U/mL — ABNORMAL HIGH
Gliadin IgG: <1 U/mL

## 2023-09-17 LAB — TISSUE TRANSGLUTAMINASE, IGA: (tTG) Ab, IgA: <1 U/mL

## 2023-09-17 LAB — INTERPRETATION:

## 2023-09-18 LAB — RETICULIN ANTIBODIES, IGA W TITER: Reticulin Ab, IgA: NEGATIVE {titer} (ref <2.5)

## 2023-09-19 ENCOUNTER — Ambulatory Visit: Payer: Medicaid Other | Attending: Obstetrics | Admitting: Physical Therapy

## 2023-09-19 DIAGNOSIS — R278 Other lack of coordination: Secondary | ICD-10-CM | POA: Insufficient documentation

## 2023-09-19 DIAGNOSIS — M62838 Other muscle spasm: Secondary | ICD-10-CM | POA: Insufficient documentation

## 2023-09-19 DIAGNOSIS — R102 Pelvic and perineal pain: Secondary | ICD-10-CM | POA: Insufficient documentation

## 2023-09-26 ENCOUNTER — Ambulatory Visit: Payer: Medicaid Other | Admitting: Physical Therapy

## 2023-09-26 ENCOUNTER — Encounter: Payer: Self-pay | Admitting: Physical Therapy

## 2023-09-26 DIAGNOSIS — R278 Other lack of coordination: Secondary | ICD-10-CM | POA: Diagnosis present

## 2023-09-26 DIAGNOSIS — M62838 Other muscle spasm: Secondary | ICD-10-CM

## 2023-09-26 DIAGNOSIS — R102 Pelvic and perineal pain: Secondary | ICD-10-CM | POA: Diagnosis present

## 2023-09-26 MED ORDER — WEGOVY 0.5 MG/0.5ML ~~LOC~~ SOAJ: 0.5000 mg | SUBCUTANEOUS | 1 refills | Status: DC

## 2023-09-26 NOTE — Telephone Encounter (Signed)
 Requesting: NUUVOZ  Last Visit: 08/01/2023 Next Visit: 12/13/2023 Last Refill: 09/04/2023  Please Advise

## 2023-09-26 NOTE — Addendum Note (Signed)
 Addended by: Malena Peer Y on: 09/26/2023 09:33 AM   Modules accepted: Orders

## 2023-09-26 NOTE — Therapy (Signed)
 OUTPATIENT PHYSICAL THERAPY FEMALE PELVIC TREATMENT  Patient Name: Norma Wright MRN: [id number] DOB:[birth year], 23 y.o., female Today's Date: 09/26/2023  END OF SESSION:  PT End of Session - 09/26/23 0808     Visit Number 9    Authorization Time Period [employer] approved 4 visits 09/03/2023 - 10/02/2023 auth#[id number] Medicaid[employer]    PT Start Time 0808    PT Stop Time 0845    PT Time Calculation (min) 37 min    Activity Tolerance Patient tolerated treatment well    Behavior During Therapy [facility] for tasks assessed/performed                     Past Medical History:  Diagnosis Date   Anemia    Fibromyalgia    Heart murmur    Interstitial cystitis    PID (pelvic inflammatory disease)    History reviewed. No pertinent surgical history. Patient Active Problem List   Diagnosis Date Noted   Oral contraceptive use 09/16/2023   Other fatigue 09/02/2023   Left lateral epicondylitis 09/02/2023   Myofascial neck pain 08/05/2023   Interstitial cystitis 08/01/2023   Chronic pain syndrome 07/22/2023   Bilateral headaches 07/22/2023   Adjustment disorder with depressed mood 07/22/2023   Fibromyalgia 06/24/2023   Concussion 06/13/2023   Chronic female pelvic pain 05/17/2023   Dysuria 05/17/2023   Physical deconditioning 07/25/2022   Intermittent palpitations 07/18/2022   Chest pain 07/18/2022   DOE (dyspnea on exertion) 07/18/2022   Dysmenorrhea 07/18/2022   Excessive hair growth 07/18/2022   Obesity (BMI 30-39.9) 07/18/2022   Hot flashes 06/27/2022   Abnormal bruising 06/27/2022   Anxiety 06/27/2022   Positive ANA (antinuclear antibody) 06/19/2022   Sedimentation rate elevation 06/05/2022   Polyarthralgia 06/05/2022   Constipation 03/08/2022   LLQ pain 03/08/2022   Cystic acne 07/31/2021    PCP: Lendia Boby CROME, NP-C REFERRING PROVIDER: Guadlupe Lianne DASEN, MD  REFERRING DIAG:  1. Urinary frequency  2. Dysuria  3. Chronic female pelvic pain  4. Abdominal  wall pain in left lower quadrant  5. Irregular menstrual cycle  6. Vulvodynia   THERAPY DIAG:  Pelvic pain   Other muscle spasm   Rationale for Evaluation and Treatment: Rehabilitation   ONSET DATE: 2022   SUBJECTIVE:   09/26/2023 Pt reports that she was sick last week. Still on a diet. Lost 32 lbs Feels like it helped interstitial cystitis. Less pressure.  Tried massage with figer, muscles were burning Maybe she has BV, maybe a yeast infection Doing stretches                                                         Fluid intake: Yes:      PAIN:  Are you having pain? Yes  NPRS scale: 5/10 lower abdomen Pain location: 9/10 joints and bones- pt reports autoimmune origin of pain vs fibromyalgia  Pain type: sharp, sometimes sore, constant  Pain description: constant and sharp   Aggravating factors: coffee, soda, citrus, spicy foods, marinara sauce, milk - has had to cut those out Relieving factors: lying down   PRECAUTIONS: None   RED FLAGS:  Bowel or bladder incontinence: yes- bowel, exercises have helped a little bit   WEIGHT BEARING RESTRICTIONS: Yes    FALLS:  Has patient fallen in last 6 months?  No   LIVING ENVIRONMENT:  Lives with: lives with their family  Lives in: House/apartment  Stairs: no  Has following equipment at home: None   OCCUPATION: Proofreader, works health and safety inspector job   PLOF: Independent   PATIENT GOALS: pain reduction   PERTINENT HISTORY:   Sexual abuse: No   BOWEL MOVEMENT:  Pain with bowel movement: sometimes  Type of bowel movement: 3-4 Fully empty rectum: Yes:   Leakage: No  Pads: No  Fiber supplement: No   URINATION:  Pain with urination: Yes takes AZO  Fully empty bladder: No  Stream: Weak sometimes  Urgency: Yes:   Frequency:  every 30 mis at times  Leakage: Urge to void and Walking to the bathroom  Pads: Yes: 3 menstrual pad   INTERCOURSE:  Pain with intercourse:  not sexually active, tried to do US , had to stop  Ability  to have vaginal penetration:  No      OBJECTIVE:  Very tender and tight pelvic floor. Tender to touch externally, pt in a lot of pain, unable to assess deeper layers d/t pain.  Bruising lighter, tight tissue felt in abdominal adipose tissue and left hip. Limited AROM cervical spine with a lot of guarding throughout shoulders and upper body    PATIENT SURVEYS:    PFIQ-7 48  COGNITION:  Overall cognitive status: Within functional limits for tasks assessed   SENSATION:  Light touch: Appears intact  Proprioception: Appears intact    POSTURE: rounded shoulders and forward head    LOWER EXTREMITY ROM:   Active ROM Right   Hip flexion 80   Hip flexion 80     PALPATION:                  External Perineal Exam very tender to touch                              Internal Pelvic Floor unable to today d/t pain   Patient confirms identification and approves PT to assess internal pelvic floor and treatment No   PELVIC MMT:    MMT eval  Vaginal 5/5 Internal Anal Sphincter  External Anal Sphincter  Puborectalis  Diastasis Recti -no         TONE:  High tone pelvic floor   PROLAPSE:  Unable to assess   TODAY'S TREATMENT:                                                                                     09/26/23   Manual- external pelvic floor assessment     Therapeutic activities- discussed diet and irritants- gluten, dairy and sugar                     [id number]  CLINICAL IMPRESSION:  09/26/23  Pt with tight and tender layer one, reported a lot of pain with exam (8/10), did not proceed further in. Overall post concussion symptoms are gone, pt with less joint pain, has lost about 32 lbs. Feeling better. Still some abdominal pain. Pt will continue to benefit from PT.      OBJECTIVE IMPAIRMENTS:  decreased coordination, decreased endurance, decreased knowledge of condition, difficulty walking, decreased ROM, increased muscle spasms, obesity, and pain.    ACTIVITY LIMITATIONS: carrying, lifting, bending, standing, and squatting   PARTICIPATION LIMITATIONS: occupation and yard work   PERSONAL FACTORS: Fitness, Time since onset of injury/illness/exacerbation, and 1-2 comorbidities: polymyalgia and IC  are also affecting patient's functional outcome.   REHAB POTENTIAL: Good   CLINICAL DECISION MAKING: Stable/uncomplicated   EVALUATION COMPLEXITY: Low    GOALS:  Goals reviewed with patient? Yes   SHORT TERM GOALS: Target date: 09/06/2023 updated      1. Pt to demonstrate improved coordination of pelvic floor and breathing mechanics with 10# squat with appropriate synergistic patterns to decrease pain and leakage at least 75% of the time.    Baseline:  Goal status: progressing  2.  Pt will report decreased pain at vulva to max 5/10 and be I with strategies for self care  Baseline:  Goal status: not met ( can be 9/10)  3.  Pt will be I with HEP and demonstrate  all exercises correctly  Baseline:  Goal status: progressing  4.  Pt will lower her PFIQ-7 by at least 15 points  Baseline: 67  Goal status: 48  LONG TERM GOALS: Target date: 11/01/2023      1. Pt will be independent with the knack, urge suppression technique, and double voiding in order to improve bladder habits and decrease urinary frequency  Baseline:  Goal status: INITIAL   2.  Pt will report max 3/10 pain at perineum and will be I with manual strategies ( abdominal and perineal massage) in order to [facility] improved self care and reduce pain  Baseline:  Goal status: progressing   3.  Patient will have reduced her PFIQ-7 to max 30 points  Baseline:  Goal status: INITIAL    PLAN: reeval completed, cont PT once/ week for 8 visits  PT FREQUENCY: 1x/week   PT DURATION: 8 sessions  PLANNED INTERVENTIONS: Therapeutic exercises, Therapeutic activity, Neuromuscular re-education, Balance training, Gait training, Patient/Family education, Self Care, Joint  mobilization, Dry Needling, Electrical stimulation, Spinal manipulation, Spinal mobilization, Moist heat, Compression bandaging, scar mobilization, and Manual therapy   PLAN FOR NEXT SESSION: internal pelvic floor muscle assessment  and treatment, cont down training, bladder diary  Jhalil Silvera, PT 09/26/23 8:11 AM

## 2023-09-27 ENCOUNTER — Encounter (HOSPITAL_BASED_OUTPATIENT_CLINIC_OR_DEPARTMENT_OTHER): Payer: Self-pay | Admitting: Physical Therapy

## 2023-09-27 ENCOUNTER — Ambulatory Visit (HOSPITAL_BASED_OUTPATIENT_CLINIC_OR_DEPARTMENT_OTHER): Payer: Medicaid Other | Attending: Physical Medicine and Rehabilitation | Admitting: Physical Therapy

## 2023-09-27 DIAGNOSIS — M2550 Pain in unspecified joint: Secondary | ICD-10-CM | POA: Diagnosis present

## 2023-09-27 DIAGNOSIS — R2689 Other abnormalities of gait and mobility: Secondary | ICD-10-CM

## 2023-09-27 DIAGNOSIS — M6281 Muscle weakness (generalized): Secondary | ICD-10-CM

## 2023-09-27 DIAGNOSIS — M797 Fibromyalgia: Secondary | ICD-10-CM | POA: Insufficient documentation

## 2023-09-27 NOTE — Therapy (Signed)
 OUTPATIENT PHYSICAL THERAPY LOWER EXTREMITY EVALUATION   Patient Name: Norma Wright MRN: [id number] DOB:November 06, 2000, 23 y.o., female Today's Date: 09/27/2023  END OF SESSION:  PT End of Session - 09/27/23 1230     Visit Number 1    Number of Visits 10    Date for PT Re-Evaluation 11/22/23    Authorization Type [place] healthy medicaid    PT Start Time 0815    PT Stop Time 0855    PT Time Calculation (min) 40 min    Activity Tolerance Patient tolerated treatment well    Behavior During Therapy WFL for tasks assessed/performed             Past Medical History:  Diagnosis Date   Anemia    Fibromyalgia    Heart murmur    Interstitial cystitis    PID (pelvic inflammatory disease)    History reviewed. No pertinent surgical history. Patient Active Problem List   Diagnosis Date Noted   Oral contraceptive use 09/16/2023   Other fatigue 09/02/2023   Left lateral epicondylitis 09/02/2023   Myofascial neck pain 08/05/2023   Interstitial cystitis 08/01/2023   Chronic pain syndrome 07/22/2023   Bilateral headaches 07/22/2023   Adjustment disorder with depressed mood 07/22/2023   Fibromyalgia 06/24/2023   Concussion 06/13/2023   Chronic female pelvic pain 05/17/2023   Dysuria 05/17/2023   Physical deconditioning 07/25/2022   Intermittent palpitations 07/18/2022   Chest pain 07/18/2022   DOE (dyspnea on exertion) 07/18/2022   Dysmenorrhea 07/18/2022   Excessive hair growth 07/18/2022   Obesity (BMI 30-39.9) 07/18/2022   Hot flashes 06/27/2022   Abnormal bruising 06/27/2022   Anxiety 06/27/2022   Positive ANA (antinuclear antibody) 06/19/2022   Sedimentation rate elevation 06/05/2022   Polyarthralgia 06/05/2022   Constipation 03/08/2022   LLQ pain 03/08/2022   Cystic acne 07/31/2021    PCP: Tinnie Johnita Rho NP  REFERRING PROVIDER: Emeline Joesph BROCKS, DO   REFERRING DIAG:  M79.7 (ICD-10-CM) - Fibromyalgia  M25.50 (ICD-10-CM) - Polyarthralgia  E66.01 (ICD-10-CM) -  Severe obesity (BMI >= 40) (HCC)    THERAPY DIAG:  Fibromyalgia  Muscle weakness (generalized)  Rationale for Evaluation and Treatment: Rehabilitation  ONSET DATE: years  SUBJECTIVE:   SUBJECTIVE STATEMENT: All over body pain since I was little.  Was dx with fibromyalgia about a year ago.  My shoulders and arms are really hurting right now but it is also in my back and legs.  My knees hurt, keep me from standing or walking for long.  PERTINENT HISTORY: past medical history of Anemia, Fibromyalgia, Heart murmur, Interstitial cystitis, and PID (pelvic inflammatory disease)   Aquatherapy referral. For fibromyalgia and obesity. Work on gentle mobilization, improvements in endurance, stretching, massage, and gradual increase in cardiovascular exercise regimen for HEP.    PAIN:  Are you having pain? Yes: NPRS scale: current 6.5/10; worst: 9/10; least 5/10 Pain location: bilat shoulders; back and legs Pain description: ache Aggravating factors: movement/no movement Relieving factors: movement  PRECAUTIONS: None  RED FLAGS: None   WEIGHT BEARING RESTRICTIONS: No  FALLS:  Has patient fallen in last 6 months? No  LIVING ENVIRONMENT:  Lives with: lives with their family  Lives in: House/apartment  Stairs: no  Has following equipment at home: None   OCCUPATION: Proofreader, works health and safety inspector job   PLOF: Independent   PATIENT GOALS: pain reduction   NEXT MD VISIT: as needed  OBJECTIVE:  Note: Objective measures were completed at Evaluation unless otherwise noted.  PATIENT SURVEYS:  LEFS  23/80  COGNITION: Overall cognitive status: Within functional limits for tasks assessed     SENSATION: WFL   MUSCLE LENGTH: Hamstrings: wfl tested in sitting   PALPATION: Mild TTP throughout paraspinals traps and shoulders Muscle tightness  LOWER EXTREMITY ROM:  Active ROM Right eval Left eval  Hip flexion    Hip extension    Hip abduction    Hip adduction    Hip  internal rotation    Hip external rotation    Knee flexion    Knee extension Hyper by ~5d Hyper by ~5d  Ankle dorsiflexion    Ankle plantarflexion    Ankle inversion    Ankle eversion     (Blank rows = not tested)  LOWER EXTREMITY MMT:  MMT Right eval Left eval  Hip flexion 29.8 36.6  Hip extension    Hip abduction 21.1 24.0  Hip adduction    Hip internal rotation    Hip external rotation    Knee flexion    Knee extension 27.4 26.3  Ankle dorsiflexion    Ankle plantarflexion    Ankle inversion    Ankle eversion     (Blank rows = not tested)  FUNCTIONAL TESTS:  5 times sit to stand: 20.1 Timed up and go (TUG): 12.30  GAIT: Distance walked: 450ft Assistive device utilized: None Level of assistance: Complete Independence Comments: slowed cadence, some knee knocking                                                                                                                                TREATMENT  Eval   PATIENT EDUCATION:  Education details: Discussed eval findings, rehab rationale, aquatic program progression/POC and pools in area. Patient is in agreement  Person educated: Patient Education method: Explanation Education comprehension: verbalized understanding  HOME EXERCISE PROGRAM: TBA  ASSESSMENT:  CLINICAL IMPRESSION: Patient is a 23 y.o. f who was seen today for physical therapy evaluation and treatment for fibromyalgia.  She reports she has been limited in her functional mobility and toleration to activity for many years. 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  Main complaint today is pain in ue and shoulders. She is TTP throughout upper and lower back with tightness appreciated throughout paraspinals. Of note: she also hyper extends at knees suggesting hypermobility. She does complain of knee pain with standing and walking.  She has had land based PT in recent past with no improvements in pain or mobility  including DN. She will benefit from skilled aquatic therapy using the properties of water to improve function, toleration to activity and improve pain management.   OBJECTIVE IMPAIRMENTS: Abnormal gait, decreased activity tolerance, decreased balance, decreased coordination, decreased endurance, difficulty walking, decreased strength, obesity, pain, and hypermobility .   ACTIVITY LIMITATIONS: lifting, bending, sitting, standing, squatting, stairs, and transfers  PARTICIPATION LIMITATIONS: cleaning, shopping, community activity, occupation, and yard work  PERSONAL FACTORS: Age,  Fitness, Past/current experiences, Time since onset of injury/illness/exacerbation, and 1-2 comorbidities: see PmHx  are also affecting patient's functional outcome.   REHAB POTENTIAL: Good  CLINICAL DECISION MAKING: Evolving/moderate complexity  EVALUATION COMPLEXITY: Moderate   GOALS: Goals reviewed with patient? Yes  SHORT TERM GOALS: Target date: 10/23/23 Pt will tolerate full aquatic sessions consistently without increase in pain and with improving function to demonstrate good toleration and effectiveness of intervention.  Baseline: Goal status: INITIAL  2.  Pt will report reduction on all pain with submersion to 2/10 or less Baseline: see chart Goal status: INITIAL  3.  Pt will report awareness and follow through with avoiding locking knees into extension with standing to decrease knee pain Baseline:  Goal status: INITIAL    LONG TERM GOALS: Target date: 11/22/23  Pt will improve on LEFS by at least 9 points to demonstrate a statistically  significant improvement in function Baseline: 23/80 Goal status: INITIAL  2.  Pt will improve on 5 X STS test to <or=  15s  to demonstrate improving functional lower extremity strength, transitional movements, and balance Baseline: 20.1 Goal status: INITIAL  3.  Pt will improve strength in all areas listed by 10lbs to demonstrate improved overall physical  function Baseline: see chart Goal status: INITIAL  4.  Pt will report overall reduction of pain by at least 3 NPRS from all levels of chronic pain max, min and current Baseline: see chart Goal status: INITIAL  5.  Pt will be indep with final Aquatic HEP for continued management of condition along with access to pool. Baseline:  Goal status: INITIAL   PLAN:  PT FREQUENCY: 1-2x/week  PT DURATION: 8 weeks  PLANNED INTERVENTIONS: 97164- PT Re-evaluation, 97110-Therapeutic exercises, 97530- Therapeutic activity, 97112- Neuromuscular re-education, 97535- Self Care, 02859- Manual therapy, 97760- Orthotic Fit/training, [id number]- Aquatic Therapy, [phone]- Ionotophoresis 4mg /ml Dexamethasone , Patient/Family education, Balance training, Stair training, Taping, Dry Needling, Joint mobilization, DME instructions, Cryotherapy, and Moist heat  PLAN FOR NEXT SESSION: aquatics only   Ronal [place]) Slevin Gunby MPT 09/27/23 12:32 PM [facility] [address] [place], [place], [address] Phone: [phone]   Fax:  [phone]  For all possible CPT codes, reference the Planned Interventions line above.     Check all conditions that are expected to impact treatment: {Conditions expected to impact treatment:Morbid obesity, Musculoskeletal disorders, and Social determinants of health   If treatment provided at initial evaluation, no treatment charged due to lack of authorization.

## 2023-10-01 ENCOUNTER — Ambulatory Visit (HOSPITAL_BASED_OUTPATIENT_CLINIC_OR_DEPARTMENT_OTHER): Payer: Medicaid Other | Admitting: Physical Therapy

## 2023-10-01 ENCOUNTER — Encounter (HOSPITAL_BASED_OUTPATIENT_CLINIC_OR_DEPARTMENT_OTHER): Payer: Self-pay | Admitting: Physical Therapy

## 2023-10-01 DIAGNOSIS — M797 Fibromyalgia: Secondary | ICD-10-CM

## 2023-10-01 DIAGNOSIS — M6281 Muscle weakness (generalized): Secondary | ICD-10-CM

## 2023-10-01 DIAGNOSIS — M62838 Other muscle spasm: Secondary | ICD-10-CM

## 2023-10-01 DIAGNOSIS — R2689 Other abnormalities of gait and mobility: Secondary | ICD-10-CM

## 2023-10-01 NOTE — Therapy (Signed)
 OUTPATIENT PHYSICAL THERAPY LOWER EXTREMITY TREATMENT   Patient Name: Norma Wright MRN: [id number] DOB:11-17-2000, 23 y.o., female Today's Date: 10/01/2023  END OF SESSION:  PT End of Session - 10/01/23 1703     Visit Number 2    Number of Visits 10    Date for PT Re-Evaluation 11/22/23    Authorization Type [place] healthy medicaid    Authorization Time Period 6 visits - 09/27/23-11/25/23    Authorization - Visit Number 2    Authorization - Number of Visits 6    PT Start Time 1655    PT Stop Time 1735    PT Time Calculation (min) 40 min    Activity Tolerance Patient tolerated treatment well    Behavior During Therapy WFL for tasks assessed/performed             Past Medical History:  Diagnosis Date   Anemia    Fibromyalgia    Heart murmur    Interstitial cystitis    PID (pelvic inflammatory disease)    History reviewed. No pertinent surgical history. Patient Active Problem List   Diagnosis Date Noted   Oral contraceptive use 09/16/2023   Other fatigue 09/02/2023   Left lateral epicondylitis 09/02/2023   Myofascial neck pain 08/05/2023   Interstitial cystitis 08/01/2023   Chronic pain syndrome 07/22/2023   Bilateral headaches 07/22/2023   Adjustment disorder with depressed mood 07/22/2023   Fibromyalgia 06/24/2023   Concussion 06/13/2023   Chronic female pelvic pain 05/17/2023   Dysuria 05/17/2023   Physical deconditioning 07/25/2022   Intermittent palpitations 07/18/2022   Chest pain 07/18/2022   DOE (dyspnea on exertion) 07/18/2022   Dysmenorrhea 07/18/2022   Excessive hair growth 07/18/2022   Obesity (BMI 30-39.9) 07/18/2022   Hot flashes 06/27/2022   Abnormal bruising 06/27/2022   Anxiety 06/27/2022   Positive ANA (antinuclear antibody) 06/19/2022   Sedimentation rate elevation 06/05/2022   Polyarthralgia 06/05/2022   Constipation 03/08/2022   LLQ pain 03/08/2022   Cystic acne 07/31/2021    PCP: Tinnie Johnita Rho NP  REFERRING PROVIDER: Emeline Joesph BROCKS, DO   REFERRING DIAG:  M79.7 (ICD-10-CM) - Fibromyalgia  M25.50 (ICD-10-CM) - Polyarthralgia  E66.01 (ICD-10-CM) - Severe obesity (BMI >= 40) (HCC)    THERAPY DIAG:  Fibromyalgia  Muscle weakness (generalized)  Other abnormalities of gait and mobility  Other muscle spasm  Rationale for Evaluation and Treatment: Rehabilitation  ONSET DATE: years  SUBJECTIVE:   SUBJECTIVE STATEMENT: Pt reports no changes since evaluation.  She reports she gets ~1000 steps per day (per her fit tracker). She is not afraid of water.   PERTINENT HISTORY: past medical history of Anemia, Fibromyalgia, Heart murmur, Interstitial cystitis, and PID (pelvic inflammatory disease)   Aquatherapy referral. For fibromyalgia and obesity. Work on gentle mobilization, improvements in endurance, stretching, massage, and gradual increase in cardiovascular exercise regimen for HEP.    PAIN:  Are you having pain? Yes: NPRS scale: current 7/10 Pain location: bilat legs Pain description: ache Aggravating factors: movement/no movement Relieving factors: movement  PRECAUTIONS: None  RED FLAGS: None   WEIGHT BEARING RESTRICTIONS: No  FALLS:  Has patient fallen in last 6 months? No  LIVING ENVIRONMENT:  Lives with: lives with their family  Lives in: House/apartment  Stairs: no  Has following equipment at home: None   OCCUPATION: Proofreader, works health and safety inspector job   PLOF: Independent   PATIENT GOALS: pain reduction   NEXT MD VISIT: as needed  OBJECTIVE:  Note: Objective measures were completed at  Evaluation unless otherwise noted.  PATIENT SURVEYS:  LEFS 23/80  COGNITION: Overall cognitive status: Within functional limits for tasks assessed     SENSATION: WFL   MUSCLE LENGTH: Hamstrings: wfl tested in sitting   PALPATION: Mild TTP throughout paraspinals traps and shoulders Muscle tightness  LOWER EXTREMITY ROM:  Active ROM Right eval Left eval  Hip flexion    Hip  extension    Hip abduction    Hip adduction    Hip internal rotation    Hip external rotation    Knee flexion    Knee extension Hyper by ~5d Hyper by ~5d  Ankle dorsiflexion    Ankle plantarflexion    Ankle inversion    Ankle eversion     (Blank rows = not tested)  LOWER EXTREMITY MMT:  MMT Right eval Left eval  Hip flexion 29.8 36.6  Hip extension    Hip abduction 21.1 24.0  Hip adduction    Hip internal rotation    Hip external rotation    Knee flexion    Knee extension 27.4 26.3  Ankle dorsiflexion    Ankle plantarflexion    Ankle inversion    Ankle eversion     (Blank rows = not tested)  FUNCTIONAL TESTS:  5 times sit to stand: 20.1 Timed up and go (TUG): 12.30  GAIT: Distance walked: 463ft Assistive device utilized: None Level of assistance: Complete Independence Comments: slowed cadence, some knee knocking                                                                                                                                TREATMENT  Pt seen for aquatic therapy today.  Treatment took place in water 3.5-4.75 ft in depth at the [facility] pool. Temp of water was 91.  Pt entered/exited the pool via stairs independently with bilat rail. - intro to aquatic therapy principles and properties  - unsupported in 4+ ft: walking forward/ backward with cues for vertical trunk and allowing knees to bend - side stepping R/L (complains of burning in lower legs)  -return to forward /backwards walking  - side stepping R/L with arm addct with rainbow hand floats (improved tolerance)  - UE on wall:  marching; hip abdct/ addct x 10; heel/toe raises x 10; hip circles 2 x 10 - straddling noodle holding wall: cycling and hip abdct/ addct -> trial holding yellow hand floats - difficulty with balance - forward walking kick and backwards walk x 2 laps  - UE on yellow hand floats - braiding R/L x 1 lap - seated on 3rd step:  cycling, flutter kick, hip abdct/ addct    Pt requires the buoyancy and hydrostatic pressure of water for support, and to offload joints by unweighting joint load by at least 50 % in navel deep water and by at least 75-80% in chest to neck deep water.  Viscosity of the water is needed for resistance of strengthening.  Water current perturbations provides challenge to standing balance requiring increased core activation.     PATIENT EDUCATION:  Education details: intro to aquatic therapy; DOMS expectations Person educated: Patient Education method: Explanation Education comprehension: verbalized understanding  HOME EXERCISE PROGRAM: TBA  ASSESSMENT:  CLINICAL IMPRESSION: Pt is confident in aquatic setting and able to take direction from therapist on deck.  She reported burning in lower legs with side stepping; decreased with change in direction.  She reported reduction of pain to 6/10 feeling ok by end of session.  She was unable to gain full balance while suspended on noodle with hand floats at surface, but will likely improve with repeated exposure.  Goals are ongoing.    From initial evaluation:  Patient is a 23 y.o. female who was seen today for physical therapy evaluation and treatment for fibromyalgia.  She reports she has been limited in her functional mobility and toleration to activity for many years. 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  Main complaint today is pain in ue and shoulders. She is TTP throughout upper and lower back with tightness appreciated throughout paraspinals. Of note: she also hyper extends at knees suggesting hypermobility. She does complain of knee pain with standing and walking.  She has had land based PT in recent past with no improvements in pain or mobility including DN. She will benefit from skilled aquatic therapy using the properties of water to improve function, toleration to activity and improve pain management.   OBJECTIVE IMPAIRMENTS:  Abnormal gait, decreased activity tolerance, decreased balance, decreased coordination, decreased endurance, difficulty walking, decreased strength, obesity, pain, and hypermobility .   ACTIVITY LIMITATIONS: lifting, bending, sitting, standing, squatting, stairs, and transfers  PARTICIPATION LIMITATIONS: cleaning, shopping, community activity, occupation, and yard work  PERSONAL FACTORS: Age, Fitness, Past/current experiences, Time since onset of injury/illness/exacerbation, and 1-2 comorbidities: see PmHx  are also affecting patient's functional outcome.   REHAB POTENTIAL: Good  CLINICAL DECISION MAKING: Evolving/moderate complexity  EVALUATION COMPLEXITY: Moderate   GOALS: Goals reviewed with patient? Yes  SHORT TERM GOALS: Target date: 10/23/23 Pt will tolerate full aquatic sessions consistently without increase in pain and with improving function to demonstrate good toleration and effectiveness of intervention.  Baseline: Goal status: INITIAL  2.  Pt will report reduction on all pain with submersion to 2/10 or less Baseline: see chart Goal status: INITIAL  3.  Pt will report awareness and follow through with avoiding locking knees into extension with standing to decrease knee pain Baseline:  Goal status: INITIAL    LONG TERM GOALS: Target date: 11/22/23  Pt will improve on LEFS by at least 9 points to demonstrate a statistically  significant improvement in function Baseline: 23/80 Goal status: INITIAL  2.  Pt will improve on 5 X STS test to <or=  15s  to demonstrate improving functional lower extremity strength, transitional movements, and balance Baseline: 20.1 Goal status: INITIAL  3.  Pt will improve strength in all areas listed by 10lbs to demonstrate improved overall physical function Baseline: see chart Goal status: INITIAL  4.  Pt will report overall reduction of pain by at least 3 NPRS from all levels of chronic pain max, min and current Baseline: see  chart Goal status: INITIAL  5.  Pt will be indep with final Aquatic HEP for continued management of condition along with access to pool. Baseline:  Goal status: INITIAL   PLAN:  PT FREQUENCY: 1-2x/week  PT DURATION: 8  weeks  PLANNED INTERVENTIONS: 97164- PT Re-evaluation, 97110-Therapeutic exercises, 97530- Therapeutic activity, 97112- Neuromuscular re-education, 97535- Self Care, 02859- Manual therapy, 97760- Orthotic Fit/training, [id number]- Aquatic Therapy, [phone]- Ionotophoresis 4mg /ml Dexamethasone , Patient/Family education, Balance training, Stair training, Taping, Dry Needling, Joint mobilization, DME instructions, Cryotherapy, and Moist heat  PLAN FOR NEXT SESSION: aquatics only   [place], [place] 10/01/23 5:44 PM [facility] [address] [place], [place], [address] Phone: [phone]   Fax:  [phone]   For all possible CPT codes, reference the Planned Interventions line above.     Check all conditions that are expected to impact treatment: {Conditions expected to impact treatment:Morbid obesity, Musculoskeletal disorders, and Social determinants of health   If treatment provided at initial evaluation, no treatment charged due to lack of authorization.

## 2023-10-07 ENCOUNTER — Ambulatory Visit (HOSPITAL_BASED_OUTPATIENT_CLINIC_OR_DEPARTMENT_OTHER): Payer: Medicaid Other | Admitting: Physical Therapy

## 2023-10-07 ENCOUNTER — Encounter (HOSPITAL_BASED_OUTPATIENT_CLINIC_OR_DEPARTMENT_OTHER): Payer: Self-pay | Admitting: Physical Therapy

## 2023-10-07 DIAGNOSIS — M797 Fibromyalgia: Secondary | ICD-10-CM

## 2023-10-07 DIAGNOSIS — M6281 Muscle weakness (generalized): Secondary | ICD-10-CM

## 2023-10-07 DIAGNOSIS — R2689 Other abnormalities of gait and mobility: Secondary | ICD-10-CM

## 2023-10-07 NOTE — Therapy (Signed)
OUTPATIENT PHYSICAL THERAPY LOWER EXTREMITY TREATMENT   Patient Name: Norma Wright MRN: [id number] DOB:05-09-2001, 23 y.o., female Today's Date: 10/07/2023  END OF SESSION:  PT End of Session - 10/07/23 1636     Visit Number 3    Number of Visits 10    Date for PT Re-Evaluation 11/22/23    Authorization Type [place] healthy medicaid    Authorization Time Period 6 visits - 09/27/23-11/25/23    Authorization - Number of Visits 6    PT Start Time 1634    PT Stop Time 1715    PT Time Calculation (min) 41 min    Activity Tolerance Patient tolerated treatment well    Behavior During Therapy WFL for tasks assessed/performed             Past Medical History:  Diagnosis Date   Anemia    Fibromyalgia    Heart murmur    Interstitial cystitis    PID (pelvic inflammatory disease)    History reviewed. No pertinent surgical history. Patient Active Problem List   Diagnosis Date Noted   Oral contraceptive use 09/16/2023   Other fatigue 09/02/2023   Left lateral epicondylitis 09/02/2023   Myofascial neck pain 08/05/2023   Interstitial cystitis 08/01/2023   Chronic pain syndrome 07/22/2023   Bilateral headaches 07/22/2023   Adjustment disorder with depressed mood 07/22/2023   Fibromyalgia 06/24/2023   Concussion 06/13/2023   Chronic female pelvic pain 05/17/2023   Dysuria 05/17/2023   Physical deconditioning 07/25/2022   Intermittent palpitations 07/18/2022   Chest pain 07/18/2022   DOE (dyspnea on exertion) 07/18/2022   Dysmenorrhea 07/18/2022   Excessive hair growth 07/18/2022   Obesity (BMI 30-39.9) 07/18/2022   Hot flashes 06/27/2022   Abnormal bruising 06/27/2022   Anxiety 06/27/2022   Positive ANA (antinuclear antibody) 06/19/2022   Sedimentation rate elevation 06/05/2022   Polyarthralgia 06/05/2022   Constipation 03/08/2022   LLQ pain 03/08/2022   Cystic acne 07/31/2021    PCP: Novella Rob NP  REFERRING PROVIDER: Angelina Sheriff, DO   REFERRING DIAG:   M79.7 (ICD-10-CM) - Fibromyalgia  M25.50 (ICD-10-CM) - Polyarthralgia  E66.01 (ICD-10-CM) - Severe obesity (BMI >= 40) (HCC)    THERAPY DIAG:  Fibromyalgia  Muscle weakness (generalized)  Other abnormalities of gait and mobility  Rationale for Evaluation and Treatment: Rehabilitation  ONSET DATE: years  SUBJECTIVE:   SUBJECTIVE STATEMENT: Pt reports feeling pretty good after 1st aquatic session. Pain slightly down  PERTINENT HISTORY: past medical history of Anemia, Fibromyalgia, Heart murmur, Interstitial cystitis, and PID (pelvic inflammatory disease)   Aquatherapy referral. For fibromyalgia and obesity. Work on gentle mobilization, improvements in endurance, stretching, massage, and gradual increase in cardiovascular exercise regimen for HEP.    PAIN:  Are you having pain? Yes: NPRS scale: current 5/10 Pain location: bilat legs Pain description: ache Aggravating factors: movement/no movement Relieving factors: movement  PRECAUTIONS: None  RED FLAGS: None   WEIGHT BEARING RESTRICTIONS: No  FALLS:  Has patient fallen in last 6 months? No  LIVING ENVIRONMENT:  Lives with: lives with their family  Lives in: House/apartment  Stairs: no  Has following equipment at home: None   OCCUPATION: Proofreader, works Health and safety inspector job   PLOF: Independent   PATIENT GOALS: pain reduction   NEXT MD VISIT: as needed  OBJECTIVE:  Note: Objective measures were completed at Evaluation unless otherwise noted.  PATIENT SURVEYS:  LEFS 23/80  COGNITION: Overall cognitive status: Within functional limits for tasks assessed     SENSATION:  WFL   MUSCLE LENGTH: Hamstrings: wfl tested in sitting   PALPATION: Mild TTP throughout paraspinals traps and shoulders Muscle tightness  LOWER EXTREMITY ROM:  Active ROM Right eval Left eval  Hip flexion    Hip extension    Hip abduction    Hip adduction    Hip internal rotation    Hip external rotation    Knee flexion     Knee extension Hyper by ~5d Hyper by ~5d  Ankle dorsiflexion    Ankle plantarflexion    Ankle inversion    Ankle eversion     (Blank rows = not tested)  LOWER EXTREMITY MMT:  MMT Right eval Left eval  Hip flexion 29.8 36.6  Hip extension    Hip abduction 21.1 24.0  Hip adduction    Hip internal rotation    Hip external rotation    Knee flexion    Knee extension 27.4 26.3  Ankle dorsiflexion    Ankle plantarflexion    Ankle inversion    Ankle eversion     (Blank rows = not tested)  FUNCTIONAL TESTS:  5 times sit to stand: 20.1 Timed up and go (TUG): 12.30  GAIT: Distance walked: 420ft Assistive device utilized: None Level of assistance: Complete Independence Comments: slowed cadence, some knee knocking                                                                                                                                TREATMENT  Pt seen for aquatic therapy today.  Treatment took place in water 3.5-4.75 ft in depth at the [facility] pool. Temp of water was 91.  Pt entered/exited the pool via stairs independently with bilat rail.  - unsupported in 4+ ft: walking forward/ backward with cues for vertical trunk and allowing knees to bend - side stepping R/L completed in 4.2 ft then 3.6  -  3.8 ft side stepping R/L with arm addct with rainbow hand floats x 6 widths - UE on wall:  marching x 12; hip abdct/ addct x 12; heel/toe raises x 12; heel raises x 12; -sitting balance on noodle straddling ue support on yellow HB. Gained position -> cycling x 4 widths. VC for increased speed  -UE support on corner wall hip add/abd and flex/ext - UE on yellow hand floats - braiding R/L x 4 widths - forward walking kick and backwards walk x 2 laps   Pt requires the buoyancy and hydrostatic pressure of water for support, and to offload joints by unweighting joint load by at least 50 % in navel deep water and by at least 75-80% in chest to neck deep water.  Viscosity  of the water is needed for resistance of strengthening. Water current perturbations provides challenge to standing balance requiring increased core activation.     PATIENT EDUCATION:  Education details: intro to aquatic therapy; DOMS expectations Person educated: Patient Education method: Explanation Education comprehension: verbalized understanding  HOME EXERCISE PROGRAM: TBA  ASSESSMENT:  CLINICAL IMPRESSION: Pt with good response to last session. Slight muscle soreness but no increase in pain or fatigue.  She continues to complain of calf /ant tib burning with submersion likely from the hydrostatic pressure (4/10 in 4.2 ft), which decreases with decreased submersion (2/10 in 3.6 ft). Pt reports she had no residual burning sensation post last session. She demonstrates improvement with sitting balance on noodle. Cues for increased cycling speed for added resistance/strengthening. She has difficulty coordinating hip extension with hip flex in suspended vertical. Very good toleration to session    From initial evaluation:  Patient is a 23 y.o. female who was seen today for physical therapy evaluation and treatment for fibromyalgia.  She reports she has been limited in her functional mobility and toleration to activity for many years. 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  Main complaint today is pain in ue and shoulders. She is TTP throughout upper and lower back with tightness appreciated throughout paraspinals. Of note: she also hyper extends at knees suggesting hypermobility. She does complain of knee pain with standing and walking.  She has had land based PT in recent past with no improvements in pain or mobility including DN. She will benefit from skilled aquatic therapy using the properties of water to improve function, toleration to activity and improve pain management.   OBJECTIVE IMPAIRMENTS: Abnormal gait, decreased activity tolerance,  decreased balance, decreased coordination, decreased endurance, difficulty walking, decreased strength, obesity, pain, and hypermobility .   ACTIVITY LIMITATIONS: lifting, bending, sitting, standing, squatting, stairs, and transfers  PARTICIPATION LIMITATIONS: cleaning, shopping, community activity, occupation, and yard work  PERSONAL FACTORS: Age, Fitness, Past/current experiences, Time since onset of injury/illness/exacerbation, and 1-2 comorbidities: see PmHx  are also affecting patient's functional outcome.   REHAB POTENTIAL: Good  CLINICAL DECISION MAKING: Evolving/moderate complexity  EVALUATION COMPLEXITY: Moderate   GOALS: Goals reviewed with patient? Yes  SHORT TERM GOALS: Target date: 10/23/23 Pt will tolerate full aquatic sessions consistently without increase in pain and with improving function to demonstrate good toleration and effectiveness of intervention.  Baseline: Goal status: INITIAL  2.  Pt will report reduction on all pain with submersion to 2/10 or less Baseline: see chart Goal status: INITIAL  3.  Pt will report awareness and follow through with avoiding locking knees into extension with standing to decrease knee pain Baseline:  Goal status: INITIAL    LONG TERM GOALS: Target date: 11/22/23  Pt will improve on LEFS by at least 9 points to demonstrate a statistically  significant improvement in function Baseline: 23/80 Goal status: INITIAL  2.  Pt will improve on 5 X STS test to <or=  15s  to demonstrate improving functional lower extremity strength, transitional movements, and balance Baseline: 20.1 Goal status: INITIAL  3.  Pt will improve strength in all areas listed by 10lbs to demonstrate improved overall physical function Baseline: see chart Goal status: INITIAL  4.  Pt will report overall reduction of pain by at least 3 NPRS from all levels of chronic pain max, min and current Baseline: see chart Goal status: INITIAL  5.  Pt will be indep  with final Aquatic HEP for continued management of condition along with access to pool. Baseline:  Goal status: INITIAL   PLAN:  PT FREQUENCY: 1-2x/week  PT DURATION: 8 weeks  PLANNED INTERVENTIONS: 97164- PT Re-evaluation, 97110-Therapeutic exercises, 97530- Therapeutic activity, [id number]- Neuromuscular re-education, 97535- Self Care, 16109-  Manual therapy, [id number]- Orthotic Fit/training, 91478- Aquatic Therapy, [phone]- Ionotophoresis 4mg /ml Dexamethasone, Patient/Family education, Balance training, Stair training, Taping, Dry Needling, Joint mobilization, DME instructions, Cryotherapy, and Moist heat  PLAN FOR NEXT SESSION: aquatics only   Corrie Dandy [place]) Stormy Sabol MPT 10/07/23 5:07 PM [facility] [address] [place], [place], [address] Phone: [phone]   Fax:  [phone]    For all possible CPT codes, reference the Planned Interventions line above.     Check all conditions that are expected to impact treatment: {Conditions expected to impact treatment:Morbid obesity, Musculoskeletal disorders, and Social determinants of health   If treatment provided at initial evaluation, no treatment charged due to lack of authorization.

## 2023-10-10 NOTE — Telephone Encounter (Signed)
No returned call. Encounter closed.

## 2023-10-14 ENCOUNTER — Ambulatory Visit (HOSPITAL_BASED_OUTPATIENT_CLINIC_OR_DEPARTMENT_OTHER): Payer: Medicaid Other | Admitting: Physical Therapy

## 2023-10-21 ENCOUNTER — Encounter: Payer: Self-pay | Admitting: Obstetrics

## 2023-10-21 ENCOUNTER — Encounter (HOSPITAL_BASED_OUTPATIENT_CLINIC_OR_DEPARTMENT_OTHER): Payer: Self-pay | Admitting: Physical Therapy

## 2023-10-21 ENCOUNTER — Ambulatory Visit (INDEPENDENT_AMBULATORY_CARE_PROVIDER_SITE_OTHER): Payer: Medicaid Other | Admitting: Obstetrics

## 2023-10-21 ENCOUNTER — Other Ambulatory Visit (HOSPITAL_COMMUNITY)
Admission: RE | Admit: 2023-10-21 | Discharge: 2023-10-21 | Disposition: A | Discharge status: Home / Self Care | Payer: Medicaid Other | Source: Ambulatory Visit | Attending: Obstetrics | Admitting: Obstetrics

## 2023-10-21 ENCOUNTER — Ambulatory Visit (HOSPITAL_BASED_OUTPATIENT_CLINIC_OR_DEPARTMENT_OTHER): Payer: Medicaid Other | Attending: Physical Medicine and Rehabilitation | Admitting: Physical Therapy

## 2023-10-21 VITALS — BP 111/70 | HR 74

## 2023-10-21 DIAGNOSIS — M6281 Muscle weakness (generalized): Secondary | ICD-10-CM

## 2023-10-21 DIAGNOSIS — R300 Dysuria: Secondary | ICD-10-CM | POA: Diagnosis not present

## 2023-10-21 DIAGNOSIS — G8929 Other chronic pain: Secondary | ICD-10-CM

## 2023-10-21 DIAGNOSIS — R102 Pelvic and perineal pain: Secondary | ICD-10-CM | POA: Diagnosis not present

## 2023-10-21 DIAGNOSIS — M797 Fibromyalgia: Secondary | ICD-10-CM | POA: Diagnosis present

## 2023-10-21 DIAGNOSIS — R2689 Other abnormalities of gait and mobility: Secondary | ICD-10-CM

## 2023-10-21 DIAGNOSIS — N94819 Vulvodynia, unspecified: Secondary | ICD-10-CM | POA: Insufficient documentation

## 2023-10-21 NOTE — Assessment & Plan Note (Signed)
-   The origin of pelvic floor muscle spasm can be multifactorial, including primary, reactive to a different pain source, trauma, or even part of a centralized pain syndrome.Treatment options include pelvic floor physical therapy, local (vaginal) or oral  muscle relaxants, pelvic muscle trigger point injections or centrally acting pain medications.   - encouraged to continue pelvic floor PT

## 2023-10-21 NOTE — Patient Instructions (Signed)
Continue pelvic floor physical therapy.   Please consider bladder instillations for your bladder pain.   We will contact you regarding your urine and vaginal testing.

## 2023-10-21 NOTE — Assessment & Plan Note (Signed)
-   Rx topical lidocaine 1g PRN pain up to 3x/day - offered Amitriptyline 2.5%/ gabapentin 2.5%/ baclofen 2.5% in vaginal cream. Cost prohibitive - continue pelvic floor PT appointment

## 2023-10-21 NOTE — Assessment & Plan Note (Addendum)
-   catheterized urine pending pathnostics to minimize contamination - Nuswab pending - For irritative bladder we reviewed treatment options including altering her diet to avoid irritative beverages and foods as well as attempting to decrease stress and other exacerbating factors.  We also discussed using pyridium and similar over-the-counter medications for pain relief as needed. We discussed the pentad of medications including Tums, an antihistamine such as Vistaril, amitriptyline, and L-arginine.  We also discussed in-office bladder instillations for pain flares, as well as cystoscopy with hydrodistention in the operating room, which can be both diagnostic and therapeutic. She was also given information on the [employer] Network at https://www.ic-network.com for bladder diet suggestions and patient forums for support. - encouraged to continue pelvic floor PT - encouraged pt to consider bladder instillation

## 2023-10-21 NOTE — Progress Notes (Signed)
[facility] Return Visit  SUBJECTIVE  History of Present Illness: Norma Wright is a 23 y.o. female seen in follow-up for urinary frequency, dysuria, LLQ abdominal wall, AUB, vulvodynia. Plan at last visit was pelvic floor PT, topical lidocaine.   Going to [facility]PT however only able to attend 1x/month due to PT schedule, unable to use smallest size of vaginal dilators Denies relief with topical lidocaine 2x/day, compounding pharmacy offered but cost prohibitive  Pyridium without relief Reports burning with urination and pain since treated 08/09/23 due to + wet prep with clue cells, symptoms resolves for 3 days after flagyl Symptoms persisted since end of November, pending follow-up with GYN 11/08/23 Started on Tyblume OCPS, continues to have monthly cycles despite continuous use Started on Wegovy  Past Medical History: Patient  has a past medical history of Anemia, Fibromyalgia, Heart murmur, Interstitial cystitis, and PID (pelvic inflammatory disease).   Past Surgical History: She  has no past surgical history on file.   Medications: She has a current medication list which includes the following prescription(s): diclofenac sodium, duloxetine, lidocaine, melatonin, wegovy, and tyblume.   Allergies: Patient has no known allergies.   Social History: Patient  reports that she has never smoked. She has never been exposed to tobacco smoke. She has never used smokeless tobacco. She reports that she does not drink alcohol and does not use drugs.     OBJECTIVE     Physical Exam: Vitals:   10/21/23 1528  BP: 111/70  Pulse: 74   Gen: No apparent distress, A&O x 3.  Physical Exam Constitutional:      Appearance: Normal appearance. She is not ill-appearing.  Genitourinary:     Urethral meatus normal.     Right Labia: No rash, tenderness, lesions, skin changes or Bartholin's cyst.    Left Labia: No tenderness, lesions, skin changes, Bartholin's cyst or rash.    No vaginal  discharge or bleeding.     Urethral meatus caruncle not present.    No urethral prolapse, tenderness, mass, hypermobility, discharge or stress urinary incontinence with cough stress test present.  Cardiovascular:     Rate and Rhythm: Normal rate.  Pulmonary:     Effort: Pulmonary effort is normal. No respiratory distress.  Neurological:     Mental Status: She is alert.  Vitals reviewed. Exam conducted with a chaperone present.   Straight Catheterization Procedure for PVR: After verbal consent was obtained from the patient for catheterization to assess bladder emptying and residual volume the urethra and surrounding tissues were prepped with betadine and an in and out catheterization was performed.  PVR was 70mL.  Urine appeared dark yellow. The patient tolerated the procedure well.   ASSESSMENT AND PLAN    Ms. Eakes is a 23 y.o. with:  1. Dysuria   2. 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 Vulvodynia     Dysuria Assessment & Plan: - catheterized urine pending pathnostics to minimize contamination - Nuswab pending - For irritative bladder we reviewed treatment options including altering her diet to avoid irritative beverages and foods as well as attempting to decrease stress and other exacerbating factors.  We also discussed using pyridium and similar over-the-counter medications for pain relief as needed. We discussed the pentad of medications including Tums, an antihistamine such as Vistaril, amitriptyline, and L-arginine.  We also discussed in-office bladder instillations for pain flares, as well as cystoscopy with hydrodistention in the operating room, which can be both diagnostic and therapeutic. She was also given information on  the IC Network at https://www.ic-network.com for bladder diet suggestions and patient forums for support. - encouraged to continue pelvic floor PT - encouraged pt to consider bladder instillation  Orders: -     Pathnostics Molecular Test -     Cervicovaginal  ancillary only  Chronic female pelvic pain Assessment & Plan: - The origin of pelvic floor muscle spasm can be multifactorial, including primary, reactive to a different pain source, trauma, or even part of a centralized pain syndrome.Treatment options include pelvic floor physical therapy, local (vaginal) or oral  muscle relaxants, pelvic muscle trigger point injections or centrally acting pain medications.   - encouraged to continue pelvic floor PT   Vulvodynia Assessment & Plan: - Rx topical lidocaine 1g PRN pain up to 3x/day - offered Amitriptyline 2.5%/ gabapentin 2.5%/ baclofen 2.5% in vaginal cream. Cost prohibitive - continue pelvic floor PT appointment   Time spent: I spent 36 minutes dedicated to the care of this patient on the date of this encounter to include pre-visit review of records, face-to-face time with the patient discussing dysuria, myofascial pelvic pain, vulvodynia, and post visit documentation and ordering medication/ testing.     Loleta Chance, MD

## 2023-10-21 NOTE — Therapy (Signed)
OUTPATIENT PHYSICAL THERAPY LOWER EXTREMITY TREATMENT   Patient Name: Norma Wright MRN: [id number] DOB:2001-04-17, 23 y.o., female Today's Date: 10/21/2023  END OF SESSION:  PT End of Session - 10/21/23 0801     Visit Number 4    Number of Visits 10    Date for PT Re-Evaluation 11/22/23    Authorization Type [place] [employer] medicaid    Authorization Time Period 6 visits - 09/27/23-11/25/23    Authorization - Number of Visits 6    PT Start Time 0800    PT Stop Time 0845    PT Time Calculation (min) 45 min    Activity Tolerance Patient tolerated treatment well    Behavior During Therapy WFL for tasks assessed/performed             Past Medical History:  Diagnosis Date   Anemia    Fibromyalgia    Heart murmur    Interstitial cystitis    PID (pelvic inflammatory disease)    History reviewed. No pertinent surgical history. Patient Active Problem List   Diagnosis Date Noted   Oral contraceptive use 09/16/2023   Other fatigue 09/02/2023   Left lateral epicondylitis 09/02/2023   Myofascial neck pain 08/05/2023   Interstitial cystitis 08/01/2023   Chronic pain syndrome 07/22/2023   Bilateral headaches 07/22/2023   Adjustment disorder with depressed mood 07/22/2023   Fibromyalgia 06/24/2023   Concussion 06/13/2023   Chronic female pelvic pain 05/17/2023   Dysuria 05/17/2023   Physical deconditioning 07/25/2022   Intermittent palpitations 07/18/2022   Chest pain 07/18/2022   DOE (dyspnea on exertion) 07/18/2022   Dysmenorrhea 07/18/2022   Excessive hair growth 07/18/2022   Obesity (BMI 30-39.9) 07/18/2022   Hot flashes 06/27/2022   Abnormal bruising 06/27/2022   Anxiety 06/27/2022   Positive ANA (antinuclear antibody) 06/19/2022   Sedimentation rate elevation 06/05/2022   Polyarthralgia 06/05/2022   Constipation 03/08/2022   LLQ pain 03/08/2022   Cystic acne 07/31/2021    PCP: Novella Rob NP  REFERRING PROVIDER: Angelina Sheriff, DO   REFERRING DIAG:  M79.7  (ICD-10-CM) - Fibromyalgia  M25.50 (ICD-10-CM) - Polyarthralgia  E66.01 (ICD-10-CM) - Severe obesity (BMI >= 40) (HCC)    THERAPY DIAG:  Fibromyalgia  Muscle weakness (generalized)  Other abnormalities of gait and mobility  Rationale for Evaluation and Treatment: Rehabilitation  ONSET DATE: years  SUBJECTIVE:   SUBJECTIVE STATEMENT: Pt reports feeling pretty good after 1st aquatic session. Pain slightly down  PERTINENT HISTORY: past medical history of Anemia, Fibromyalgia, Heart murmur, Interstitial cystitis, and PID (pelvic inflammatory disease)   Aquatherapy referral. For fibromyalgia and obesity. Work on gentle mobilization, improvements in endurance, stretching, massage, and gradual increase in cardiovascular exercise regimen for HEP.    PAIN:  Are you having pain? Yes: NPRS scale: current 5/10 Pain location: bilat legs Pain description: ache Aggravating factors: movement/no movement Relieving factors: movement  PRECAUTIONS: None  RED FLAGS: None   WEIGHT BEARING RESTRICTIONS: No  FALLS:  Has patient fallen in last 6 months? No  LIVING ENVIRONMENT:  Lives with: lives with their family  Lives in: House/apartment  Stairs: no  Has following equipment at home: None   OCCUPATION: Proofreader, works Health and safety inspector job   PLOF: Independent   PATIENT GOALS: pain reduction   NEXT MD VISIT: as needed  OBJECTIVE:  Note: Objective measures were completed at Evaluation unless otherwise noted.  PATIENT SURVEYS:  LEFS 23/80  COGNITION: Overall cognitive status: Within functional limits for tasks assessed     SENSATION:  WFL   MUSCLE LENGTH: Hamstrings: wfl tested in sitting   PALPATION: Mild TTP throughout paraspinals traps and shoulders Muscle tightness  LOWER EXTREMITY ROM:  Active ROM Right eval Left eval  Hip flexion    Hip extension    Hip abduction    Hip adduction    Hip internal rotation    Hip external rotation    Knee flexion    Knee  extension Hyper by ~5d Hyper by ~5d  Ankle dorsiflexion    Ankle plantarflexion    Ankle inversion    Ankle eversion     (Blank rows = not tested)  LOWER EXTREMITY MMT:  MMT Right eval Left eval  Hip flexion 29.8 36.6  Hip extension    Hip abduction 21.1 24.0  Hip adduction    Hip internal rotation    Hip external rotation    Knee flexion    Knee extension 27.4 26.3  Ankle dorsiflexion    Ankle plantarflexion    Ankle inversion    Ankle eversion     (Blank rows = not tested)  FUNCTIONAL TESTS:  5 times sit to stand: 20.1 Timed up and go (TUG): 12.30  GAIT: Distance walked: 421ft Assistive device utilized: None Level of assistance: Complete Independence Comments: slowed cadence, some knee knocking                                                                                                                                TREATMENT  Pt seen for aquatic therapy today.  Treatment took place in water 3.5-4.75 ft in depth at the [facility] pool. Temp of water was 91.  Pt entered/exited the pool via stairs independently with bilat rail.  - unsupported in 4+ ft: walking forward/ backward with cues upright positioning - side stepping R/L completed in 4.2 ft then 3.6  -  3.8 ft side stepping R/L with arm addct with rainbow hand floats x 6 widths - UE on yellow hand floats - braiding R/L x 2 widths (challenged coordination) - UE on rainbow HB:  marching x 12; hip abdct/ addct x 12; heel/toe raises x 12; heel raises x 12;  - balance: hip flex/ext x 10 - Solid noodle pull down (core strengthening) wide stance then staggered x 8-10 reps -sitting balance on noodle straddling ue support on yellow HB. Gained position -> cycling x 4 widths. VC for increased speed  -UE support on corner wall hip add/abd and flex/ext  Pt requires the buoyancy and hydrostatic pressure of water for support, and to offload joints by unweighting joint load by at least 50 % in navel deep water  and by at least 75-80% in chest to neck deep water.  Viscosity of the water is needed for resistance of strengthening. Water current perturbations provides challenge to standing balance requiring increased core activation.     PATIENT EDUCATION:  Education details: intro to aquatic therapy; DOMS expectations Person educated: Patient Education  method: Explanation Education comprehension: verbalized understanding  HOME EXERCISE PROGRAM: Aquatic HEP This aquatic home exercise program from [employer] utilizes pictures from land based exercises, but has been adapted prior to lamination and issuance.   Access Code: [email or web address] URL: [email or web address] Date: 10/21/2023 Prepared by: Geni Bers  Exercises - Side Stepping with Hand Floats  - 1 x daily - 7 x weekly - 3 sets - 10 reps - Braided Sidestepping with Arms Out  - 1 x daily - 7 x weekly - 3 sets - 10 reps - Standing Hip Abduction Adduction at Pool Wall  - 1 x daily - 7 x weekly - 3 sets - 10 reps - Standing Hip Flexion March  - 1 x daily - 7 x weekly - 3 sets - 10 reps - Standing Heel Raise with Toes Turned Out  - 1 x daily - 7 x weekly - 3 sets - 10 reps - Standing Hip Flexion Extension at [employer]  - 1 x daily - 7 x weekly - 3 sets - 10 reps - Squat  - 1 x daily - 7 x weekly - 3 sets - 10 reps - Seated Straddle on Flotation Forward Breast Stroke Arms and Bicycle Legs  - 1 x daily - 7 x weekly - 3 sets - 10 reps NOT ISSUED  ASSESSMENT:  CLINICAL IMPRESSION: Pt reports pain submerged reduces to 3/10 due to the persistent burning (which she states she "has always had" when in a pool). Overall pain reduction for past few sessions as result of aquatic therapy intervention until next day. She demonstrates good progress with core engagement and gaining balance seated on noodle. Began creating HEP. Pt reports she is trying to remember to void locking knees in extension with static standing to avoid hyperextension and  increased knee pain. Plan work hamstring strength     From initial evaluation:  Patient is a 23 y.o. female who was seen today for physical therapy evaluation and treatment for fibromyalgia.  She reports she has been limited in her functional mobility and toleration to activity for many years. 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  Main complaint today is pain in ue and shoulders. She is TTP throughout upper and lower back with tightness appreciated throughout paraspinals. Of note: she also hyper extends at knees suggesting hypermobility. She does complain of knee pain with standing and walking.  She has had land based PT in recent past with no improvements in pain or mobility including DN. She will benefit from skilled aquatic therapy using the properties of water to improve function, toleration to activity and improve pain management.   OBJECTIVE IMPAIRMENTS: Abnormal gait, decreased activity tolerance, decreased balance, decreased coordination, decreased endurance, difficulty walking, decreased strength, obesity, pain, and hypermobility .   ACTIVITY LIMITATIONS: lifting, bending, sitting, standing, squatting, stairs, and transfers  PARTICIPATION LIMITATIONS: cleaning, shopping, community activity, occupation, and yard work  PERSONAL FACTORS: Age, Fitness, Past/current experiences, Time since onset of injury/illness/exacerbation, and 1-2 comorbidities: see PmHx  are also affecting patient's functional outcome.   REHAB POTENTIAL: Good  CLINICAL DECISION MAKING: Evolving/moderate complexity  EVALUATION COMPLEXITY: Moderate   GOALS: Goals reviewed with patient? Yes  SHORT TERM GOALS: Target date: 10/23/23 Pt will tolerate full aquatic sessions consistently without increase in pain and with improving function to demonstrate good toleration and effectiveness of intervention.  Baseline: Goal status: Met 10/21/23  2.  Pt will report reduction on  all pain with submersion  to 2/10 or less Baseline: see chart Goal status: In process 10/21/23  3.  Pt will report awareness and follow through with avoiding locking knees into extension with standing to decrease knee pain Baseline:  Goal status: Met 10/21/23    LONG TERM GOALS: Target date: 11/22/23  Pt will improve on LEFS by at least 9 points to demonstrate a statistically  significant improvement in function Baseline: 23/80 Goal status: INITIAL  2.  Pt will improve on 5 X STS test to <or=  15s  to demonstrate improving functional lower extremity strength, transitional movements, and balance Baseline: 20.1 Goal status: INITIAL  3.  Pt will improve strength in all areas listed by 10lbs to demonstrate improved overall physical function Baseline: see chart Goal status: INITIAL  4.  Pt will report overall reduction of pain by at least 3 NPRS from all levels of chronic pain max, min and current Baseline: see chart Goal status: INITIAL  5.  Pt will be indep with final Aquatic HEP for continued management of condition along with access to pool. Baseline:  Goal status: INITIAL   PLAN:  PT FREQUENCY: 1-2x/week  PT DURATION: 8 weeks  PLANNED INTERVENTIONS: 97164- PT Re-evaluation, 97110-Therapeutic exercises, 97530- Therapeutic activity, 97112- Neuromuscular re-education, 97535- Self Care, 16109- Manual therapy, 97760- Orthotic Fit/training, [id number]- Aquatic Therapy, [phone]- Ionotophoresis 4mg /ml Dexamethasone, Patient/Family education, Balance training, Stair training, Taping, Dry Needling, Joint mobilization, DME instructions, Cryotherapy, and Moist heat  PLAN FOR NEXT SESSION: aquatics only   Corrie Dandy Rome) Amariz Flamenco MPT 10/21/23 8:44 AM [facility] [address] [place], [place], [address] Phone: [phone]   Fax:  [phone]    For all possible CPT codes, reference the Planned Interventions line above.     Check all  conditions that are expected to impact treatment: {Conditions expected to impact treatment:Morbid obesity, Musculoskeletal disorders, and Social determinants of health   If treatment provided at initial evaluation, no treatment charged due to lack of authorization.

## 2023-10-21 NOTE — Assessment & Plan Note (Signed)
The origin of pelvic floor muscle spasm can be multifactorial, including primary, reactive to a different pain source, trauma, or even part of a centralized pain syndrome.Treatment options include pelvic floor physical therapy, local (vaginal) or oral  muscle relaxants, pelvic muscle trigger point injections or centrally acting pain medications.   - encouraged to continue pelvic floor PT

## 2023-10-22 ENCOUNTER — Encounter: Payer: Self-pay | Admitting: Obstetrics

## 2023-10-22 LAB — CERVICOVAGINAL ANCILLARY ONLY
Bacterial Vaginitis (gardnerella): NEGATIVE
Candida Glabrata: NEGATIVE
Candida Vaginitis: NEGATIVE
Comment: NEGATIVE
Comment: NEGATIVE
Comment: NEGATIVE

## 2023-10-23 ENCOUNTER — Other Ambulatory Visit: Payer: Self-pay | Admitting: Obstetrics

## 2023-10-23 ENCOUNTER — Encounter: Payer: Self-pay | Admitting: Obstetrics

## 2023-10-23 DIAGNOSIS — B9689 Other specified bacterial agents as the cause of diseases classified elsewhere: Secondary | ICD-10-CM

## 2023-10-23 MED ORDER — CLINDAMYCIN PHOSPHATE 2 % VA CREA: 1.0000 | TOPICAL_CREAM | Freq: Every day | VAGINAL | 0 refills | Status: AC

## 2023-10-23 NOTE — Progress Notes (Signed)
 Start vaginal clindamycin  5g (1 applicator) for 7 nights.  Follow-up with GYN if patient continues to experience vaginal discharge.

## 2023-10-25 ENCOUNTER — Telehealth: Payer: Self-pay

## 2023-10-25 MED ORDER — WEGOVY 1 MG/0.5ML ~~LOC~~ SOAJ
1.0000 mg | SUBCUTANEOUS | 0 refills | Status: DC
Start: 2023-10-25 — End: 2023-11-27

## 2023-10-25 NOTE — Telephone Encounter (Signed)
 PA submitted for Clindamycin  phosphate 2% cream.   Approved today by [employer]   Medicaid PA Case: [id number], Status: Approved, Coverage Starts on: 10/25/2023 12:00:00 AM, Coverage Ends on: 10/24/2024 12:00:00 AM.

## 2023-10-25 NOTE — Addendum Note (Signed)
 Addended by: Jakhai Fant A on: 10/25/2023 02:34 PM   Modules accepted: Orders

## 2023-10-25 NOTE — Telephone Encounter (Signed)
 Requesting: Wegovy  Last Visit: 08/01/2023 Next Visit: 09/16/2023 Last Refill: 09/26/2023 Patient request to move up in dose  Please Advise

## 2023-10-28 ENCOUNTER — Telehealth: Payer: Self-pay

## 2023-10-28 ENCOUNTER — Ambulatory Visit: Payer: Medicaid Other | Admitting: Physical Therapy

## 2023-10-28 ENCOUNTER — Other Ambulatory Visit: Payer: Self-pay | Admitting: Obstetrics

## 2023-10-28 DIAGNOSIS — G8929 Other chronic pain: Secondary | ICD-10-CM

## 2023-10-28 DIAGNOSIS — B9689 Other specified bacterial agents as the cause of diseases classified elsewhere: Secondary | ICD-10-CM

## 2023-10-28 DIAGNOSIS — R1032 Left lower quadrant pain: Secondary | ICD-10-CM

## 2023-10-28 DIAGNOSIS — N94819 Vulvodynia, unspecified: Secondary | ICD-10-CM

## 2023-10-28 MED ORDER — METRONIDAZOLE 500 MG PO TABS: 500.0000 mg | ORAL_TABLET | Freq: Two times a day (BID) | ORAL | 0 refills | Status: AC

## 2023-10-28 NOTE — Progress Notes (Signed)
 Change to Rx PO flagyl  for 7 days and PV boric acid x 30d since patient is unable to apply PV clindamycin .  Patient to present to office for samples of boric acid, do NOT take by mouth.  Continue boric acid if tolerated for 30 days.

## 2023-10-28 NOTE — Telephone Encounter (Signed)
 Patient called indicating that she is having trouble with inserting her clindamycin  cream. She says its painful and is there any alternative treatment or something for nubing to help with inserting the cream.

## 2023-10-28 NOTE — Telephone Encounter (Signed)
 Norma Wright has been notified and aware of the changes made and she will come and pick up the samples of Boric acid. She has some additional questions. She said she needs her PT referral renewed. That its out of appointments so it has to be renewed.she also wants to proceed with the bladder instillations. Amy will call her tomorrow to schedule.

## 2023-10-29 NOTE — Telephone Encounter (Signed)
Referral sent to pelvic floor PT.  Plan for patient to start PV boric acid with office samples with PO flagyl, continue over the counter PV boric acid 600mg  nightly for 30 days. Consider vulvar biopsy if symptoms do not improve after boric acid.

## 2023-10-30 ENCOUNTER — Ambulatory Visit (HOSPITAL_BASED_OUTPATIENT_CLINIC_OR_DEPARTMENT_OTHER): Payer: Medicaid Other | Admitting: Physical Therapy

## 2023-10-30 ENCOUNTER — Ambulatory Visit (HOSPITAL_BASED_OUTPATIENT_CLINIC_OR_DEPARTMENT_OTHER): Payer: Self-pay | Admitting: Physical Therapy

## 2023-11-03 ENCOUNTER — Other Ambulatory Visit: Payer: Self-pay | Admitting: Radiology

## 2023-11-03 DIAGNOSIS — N946 Dysmenorrhea, unspecified: Secondary | ICD-10-CM

## 2023-11-03 DIAGNOSIS — Z30011 Encounter for initial prescription of contraceptive pills: Secondary | ICD-10-CM

## 2023-11-04 ENCOUNTER — Ambulatory Visit (INDEPENDENT_AMBULATORY_CARE_PROVIDER_SITE_OTHER): Payer: Medicaid Other

## 2023-11-04 VITALS — BP 118/70

## 2023-11-04 DIAGNOSIS — R3989 Other symptoms and signs involving the genitourinary system: Secondary | ICD-10-CM | POA: Diagnosis not present

## 2023-11-04 DIAGNOSIS — R350 Frequency of micturition: Secondary | ICD-10-CM

## 2023-11-04 LAB — POCT URINALYSIS DIPSTICK
Bilirubin, UA: NEGATIVE
Clarity, UA: NEGATIVE
Glucose, UA: NEGATIVE
Ketones, UA: NEGATIVE
Leukocytes, UA: NEGATIVE
Nitrite, UA: NEGATIVE
Protein, UA: NEGATIVE
Spec Grav, UA: 1.01 (ref 1.010–1.025)
Urobilinogen, UA: 0.2 U/dL
pH, UA: 6.5 (ref 5.0–8.0)

## 2023-11-04 MED ORDER — HEPARIN SODIUM (PORCINE) 10000 UNIT/ML IJ SOLN
10000.0000 [IU] | Freq: Once | INTRAMUSCULAR | Status: AC
  Administered 2023-11-04: 10000 [IU] via INTRAVESICAL

## 2023-11-04 MED ORDER — LIDOCAINE HCL URETHRAL/MUCOSAL 2 % EX GEL
1.0000 | Freq: Once | CUTANEOUS | Status: AC
Start: 2023-11-04 — End: 2023-11-04
  Administered 2023-11-04: 1 via URETHRAL

## 2023-11-04 MED ORDER — BUPIVACAINE HCL 0.25 % IJ SOLN
20.0000 mL | Freq: Once | INTRAMUSCULAR | Status: AC
  Administered 2023-11-04: 20 mL

## 2023-11-04 MED ORDER — LIDOCAINE HCL 2 % IJ SOLN
20.0000 mL | Freq: Once | INTRAMUSCULAR | Status: AC
  Administered 2023-11-04: 400 mg

## 2023-11-04 NOTE — Patient Instructions (Signed)
  Please drink lots of water and try to expel as much urine as you are inputting. If you are unable to urinate, give the office a call before 2 pm.     Please keep all future appointments and if you have any questions or concerns please feel free to contact our office at [phone].

## 2023-11-04 NOTE — Progress Notes (Signed)
Bladder Instillation: The patient was identified and verbally consented for the procedure.  The urethra was prepped with Betadine x 3. A 12 Fr foley catheter was inserted the bladder and drained for 1 cc. The foley was then attached to a 60 mL syringe with the plunger removed.  The medication was slowly poured into the bladder via the syringe and foley.  The medication consisted of: 20ml of Lidocaine 2%, 20mL of Bupivicaine 0.25%, 10,000 units/mL Heparin, 5mL The foley was removed and the patient was asked to hold the liquids in her bladder for 30-60 minutes if possible.   Precautions were given and patient was instructed to call the office or on-call number for any concerns.  Thressa Sheller, CMA

## 2023-11-04 NOTE — Telephone Encounter (Signed)
Med refill request:Tyblume 0.1-20 mg-mcg chew Last AEX: n/a last OV 08/09/23  Next AEX: next OV 11/08/23 Last MMG (if hormonal med) n/a  Refill authorized: Last rx 08/12/23 #84 with 1 refill. Note from pharmacy "Rx was sent 08/09/23 #84 with 0 refills". Please approve or deny as appropriate.

## 2023-11-06 ENCOUNTER — Ambulatory Visit (HOSPITAL_BASED_OUTPATIENT_CLINIC_OR_DEPARTMENT_OTHER): Payer: Medicaid Other | Admitting: Physical Therapy

## 2023-11-06 ENCOUNTER — Encounter (HOSPITAL_BASED_OUTPATIENT_CLINIC_OR_DEPARTMENT_OTHER): Payer: Self-pay | Admitting: Physical Therapy

## 2023-11-06 DIAGNOSIS — M6281 Muscle weakness (generalized): Secondary | ICD-10-CM

## 2023-11-06 DIAGNOSIS — R2689 Other abnormalities of gait and mobility: Secondary | ICD-10-CM

## 2023-11-06 DIAGNOSIS — M797 Fibromyalgia: Secondary | ICD-10-CM

## 2023-11-06 NOTE — Therapy (Signed)
OUTPATIENT PHYSICAL THERAPY LOWER EXTREMITY TREATMENT   Patient Name: Norma Wright MRN: [id number] DOB:[birth year], 23 y.o., female Today's Date: 11/06/2023  END OF SESSION:  PT End of Session - 11/06/23 0805     Visit Number 5    Number of Visits 10    Date for PT Re-Evaluation 11/22/23    Authorization Type [employer]    Authorization - Visit Number 5    Authorization - Number of Visits 6    PT Start Time 0800    PT Stop Time 0840    PT Time Calculation (min) 40 min    Activity Tolerance Patient tolerated treatment well    Behavior During Therapy WFL for tasks assessed/performed             Past Medical History:  Diagnosis Date   Anemia    Fibromyalgia    Heart murmur    Interstitial cystitis    PID (pelvic inflammatory disease)    History reviewed. No pertinent surgical history. 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   Oral contraceptive use 09/16/2023   Other fatigue 09/02/2023   Left lateral epicondylitis 09/02/2023   Myofascial neck pain 08/05/2023   Interstitial cystitis 08/01/2023   Chronic pain syndrome 07/22/2023   Bilateral headaches 07/22/2023   Adjustment disorder with depressed mood 07/22/2023   Fibromyalgia 06/24/2023   Concussion 06/13/2023   Chronic female pelvic pain 05/17/2023   Dysuria 05/17/2023   Physical deconditioning 07/25/2022   Intermittent palpitations 07/18/2022   Chest pain 07/18/2022   DOE (dyspnea on exertion) 07/18/2022   Dysmenorrhea 07/18/2022   Excessive hair growth 07/18/2022   Obesity (BMI 30-39.9) 07/18/2022   Hot flashes 06/27/2022   Abnormal bruising 06/27/2022   Anxiety 06/27/2022   Positive ANA (antinuclear antibody) 06/19/2022   Sedimentation rate elevation 06/05/2022   Polyarthralgia 06/05/2022   Constipation 03/08/2022   LLQ pain 03/08/2022   Cystic acne 07/31/2021    PCP: Novella Rob NP  REFERRING PROVIDER: Angelina Sheriff, DO   REFERRING DIAG:   M79.7 (ICD-10-CM) - Fibromyalgia  M25.50 (ICD-10-CM) - Polyarthralgia  E66.01 (ICD-10-CM) - Severe obesity (BMI >= 40) (HCC)    THERAPY DIAG:  Fibromyalgia  Muscle weakness (generalized)  Other abnormalities of gait and mobility  Rationale for Evaluation and Treatment: Rehabilitation  ONSET DATE: years  SUBJECTIVE:   SUBJECTIVE STATEMENT: Pt reports her fibromyalgia has been bad this week.  She has not gained access to pool, but plans to look into [facility] (she is attending [employer]) or [facility] . Overall pain reduction after last session provided relief until next day.  PERTINENT HISTORY: past medical history of Anemia, Fibromyalgia, Heart murmur, Interstitial cystitis, and PID (pelvic inflammatory disease)   Aquatherapy referral. For fibromyalgia and obesity. Work on gentle mobilization, improvements in endurance, stretching, massage, and gradual increase in cardiovascular exercise regimen for HEP.    PAIN:  Are you having pain? Yes: NPRS scale: current 8/10 Pain location: bilat legs, bilat arms Pain description: ache Aggravating factors: movement/no movement Relieving factors: movement  PRECAUTIONS: None  RED FLAGS: None   WEIGHT BEARING RESTRICTIONS: No  FALLS:  Has patient fallen in last 6 months? No  LIVING ENVIRONMENT:  Lives with: lives with their family  Lives in: House/apartment  Stairs: no  Has following equipment at home: None   OCCUPATION: Proofreader, works Health and safety inspector job   PLOF: Independent   PATIENT GOALS: pain reduction   NEXT MD VISIT: as needed  OBJECTIVE:  Note: Objective measures were completed at Evaluation unless otherwise noted.  PATIENT SURVEYS:  LEFS 23/80  COGNITION: Overall cognitive status: Within functional limits for tasks assessed     SENSATION: WFL   MUSCLE LENGTH: Hamstrings: wfl tested in sitting   PALPATION: Mild TTP throughout paraspinals traps and shoulders Muscle tightness  LOWER EXTREMITY  ROM:  Active ROM Right eval Left eval  Hip flexion    Hip extension    Hip abduction    Hip adduction    Hip internal rotation    Hip external rotation    Knee flexion    Knee extension Hyper by ~5d Hyper by ~5d  Ankle dorsiflexion    Ankle plantarflexion    Ankle inversion    Ankle eversion     (Blank rows = not tested)  LOWER EXTREMITY MMT:  MMT Right eval Left eval  Hip flexion 29.8 36.6  Hip extension    Hip abduction 21.1 24.0  Hip adduction    Hip internal rotation    Hip external rotation    Knee flexion    Knee extension 27.4 26.3  Ankle dorsiflexion    Ankle plantarflexion    Ankle inversion    Ankle eversion     (Blank rows = not tested)  FUNCTIONAL TESTS:  5 times sit to stand: 20.1 Timed up and go (TUG): 12.30  GAIT: Distance walked: 452ft Assistive device utilized: None Level of assistance: Complete Independence Comments: slowed cadence, some knee knocking                                                                                                                                TREATMENT  Pt seen for aquatic therapy today.  Treatment took place in water 3.5-4.75 ft in depth at the [facility] pool. Temp of water was 91.  Pt entered/exited the pool via stairs independently with bilat rail.  - unsupported in 4+ ft: walking forward/ backward with cues upright positioning and reciprocal arm swing - side stepping R/L completed in 4.2 ft  x 3 laps -side stepping R/L with arm addct with rainbow hand floats x 3 laps - UE on wall:  marching x 12; hip abdct/ addct x 12; heel/toe raises x 12 - return to walking forward/ backward 2 laps - in 83ft6" : tandem gait forward/backward 2 laps - reciprocal row with enki boards -> while walking forward / backward -> tricep push downs with enki boards while walking forward/ backward - SLS in 39ft8" with solid noodle stomp - 10 slow , 10 fast - each LE - Solid noodle pull down (core strengthening) wide  stance x 10 reps - straddling yellow noodle, UE support on yellow hand floats: cycling x 4 widths.   Pt requires the buoyancy and hydrostatic pressure of water for support, and to offload joints by unweighting joint load by at least 50 % in navel deep water and by at least 75-80%  in chest to neck deep water.  Viscosity of the water is needed for resistance of strengthening. Water current perturbations provides challenge to standing balance requiring increased core activation.  PATIENT EDUCATION:  Education details: intro to aquatic therapy; DOMS expectations Person educated: Patient Education method: Explanation Education comprehension: verbalized understanding  HOME EXERCISE PROGRAM: Aquatic HEP This aquatic home exercise program from [employer] utilizes pictures from land based exercises, but has been adapted prior to lamination and issuance.   Access Code: [email or web address] URL: [email or web address] Date: 10/21/2023 Prepared by: Geni Bers  Exercises - Side Stepping with Hand Floats  - 1 x daily - 7 x weekly - 3 sets - 10 reps - Braided Sidestepping with Arms Out  - 1 x daily - 7 x weekly - 3 sets - 10 reps - Standing Hip Abduction Adduction at Pool Wall  - 1 x daily - 7 x weekly - 3 sets - 10 reps - Standing Hip Flexion March  - 1 x daily - 7 x weekly - 3 sets - 10 reps - Standing Heel Raise with Toes Turned Out  - 1 x daily - 7 x weekly - 3 sets - 10 reps - Standing Hip Flexion Extension at [employer]  - 1 x daily - 7 x weekly - 3 sets - 10 reps - Squat  - 1 x daily - 7 x weekly - 3 sets - 10 reps - Seated Straddle on Flotation Forward Breast Stroke Arms and Bicycle Legs  - 1 x daily - 7 x weekly - 3 sets - 10 reps * NOT ISSUED  ASSESSMENT:  CLINICAL IMPRESSION: Pt reported pain submerged reduced to 7/10.  She tolerated exercises well, without increase in pain.  She demonstrates good progress with core engagement. Began creating HEP.  Plan issue and review HEP next  session (last approved visit thru insurance).  Therapist to check LTG as time allows.      From initial evaluation:  Patient is a 23 y.o. female who was seen today for physical therapy evaluation and treatment for fibromyalgia.  She reports she has been limited in her functional mobility and toleration to activity for many years. 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  Main complaint today is pain in ue and shoulders. She is TTP throughout upper and lower back with tightness appreciated throughout paraspinals. Of note: she also hyper extends at knees suggesting hypermobility. She does complain of knee pain with standing and walking.  She has had land based PT in recent past with no improvements in pain or mobility including DN. She will benefit from skilled aquatic therapy using the properties of water to improve function, toleration to activity and improve pain management.   OBJECTIVE IMPAIRMENTS: Abnormal gait, decreased activity tolerance, decreased balance, decreased coordination, decreased endurance, difficulty walking, decreased strength, obesity, pain, and hypermobility .   ACTIVITY LIMITATIONS: lifting, bending, sitting, standing, squatting, stairs, and transfers  PARTICIPATION LIMITATIONS: cleaning, shopping, community activity, occupation, and yard work  PERSONAL FACTORS: Age, Fitness, Past/current experiences, Time since onset of injury/illness/exacerbation, and 1-2 comorbidities: see PmHx  are also affecting patient's functional outcome.   REHAB POTENTIAL: Good  CLINICAL DECISION MAKING: Evolving/moderate complexity  EVALUATION COMPLEXITY: Moderate   GOALS: Goals reviewed with patient? Yes  SHORT TERM GOALS: Target date: 10/23/23 Pt will tolerate full aquatic sessions consistently without increase in pain and with improving function to demonstrate good toleration and effectiveness of intervention.  Baseline: Goal status: Met  10/21/23  2.  Pt will report reduction on all pain with submersion to 2/10 or less Baseline: see chart Goal status: In process 11/06/23  3.  Pt will report awareness and follow through with avoiding locking knees into extension with standing to decrease knee pain Baseline:  Goal status: Met 10/21/23    LONG TERM GOALS: Target date: 11/22/23  Pt will improve on LEFS by at least 9 points to demonstrate a statistically  significant improvement in function Baseline: 23/80 Goal status: INITIAL  2.  Pt will improve on 5 X STS test to <or=  15s  to demonstrate improving functional lower extremity strength, transitional movements, and balance Baseline: 20.1 Goal status: INITIAL  3.  Pt will improve strength in all areas listed by 10lbs to demonstrate improved overall physical function Baseline: see chart Goal status: INITIAL  4.  Pt will report overall reduction of pain by at least 3 NPRS from all levels of chronic pain max, min and current Baseline: see chart Goal status: INITIAL  5.  Pt will be indep with final Aquatic HEP for continued management of condition along with access to pool. Baseline:  Goal status: INITIAL   PLAN:  PT FREQUENCY: 1-2x/week  PT DURATION: 8 weeks  PLANNED INTERVENTIONS: 97164- PT Re-evaluation, 97110-Therapeutic exercises, 97530- Therapeutic activity, 97112- Neuromuscular re-education, 97535- Self Care, 69629- Manual therapy, 97760- Orthotic Fit/training, [id number]- Aquatic Therapy, [phone]- Ionotophoresis 4mg /ml Dexamethasone, Patient/Family education, Balance training, Stair training, Taping, Dry Needling, Joint mobilization, DME instructions, Cryotherapy, and Moist heat  PLAN FOR NEXT SESSION: aquatics only  [place], [place] 11/06/23 8:41 AM [facility] [address] [place], [place], [address] Phone: [phone]   Fax:  [phone]   For all possible CPT codes, reference the Planned  Interventions line above.     Check all conditions that are expected to impact treatment: {Conditions expected to impact treatment:Morbid obesity, Musculoskeletal disorders, and Social determinants of health   If treatment provided at initial evaluation, no treatment charged due to lack of authorization.

## 2023-11-08 ENCOUNTER — Ambulatory Visit (INDEPENDENT_AMBULATORY_CARE_PROVIDER_SITE_OTHER): Payer: Medicaid Other | Admitting: Radiology

## 2023-11-08 VITALS — BP 98/68 | Wt 228.6 lb

## 2023-11-08 DIAGNOSIS — N921 Excessive and frequent menstruation with irregular cycle: Secondary | ICD-10-CM | POA: Diagnosis not present

## 2023-11-08 MED ORDER — NORETHIN-ETH ESTRAD-FE BIPHAS 1 MG-10 MCG / 10 MCG PO TABS
1.0000 | ORAL_TABLET | Freq: Every day | ORAL | 2 refills | Status: DC
Start: 2023-11-08 — End: 2024-04-20

## 2023-11-08 NOTE — Progress Notes (Signed)
   Norma Wright 07-Sep-2001 [id number]   History:  23 y.o. G) presents for follow up after starting Tylbume to regulate and lighten cycles 3 months ago. Periods have not improved, passing clots. Would like to try another pill.  Gynecologic History Patient's last menstrual period was 11/06/2023 (exact date).     Obstetric History OB History  Gravida Para Term Preterm AB Living  0 0 0 0 0 0  SAB IAB Ectopic Multiple Live Births  0 0 0 0 0    The following portions of the patient's history were reviewed and updated as appropriate: allergies, current medications, past family history, past medical history, past social history, past surgical history, and problem list.  Review of Systems  All other systems reviewed and are negative.   Past medical history, past surgical history, family history and social history were all reviewed and documented in the EPIC chart.  Exam:  Vitals:   11/08/23 1008  BP: 98/68  Weight: 228 lb 9.6 oz (103.7 kg)   Body mass index is 35.8 kg/m.  Physical Exam Vitals and nursing note reviewed.  Constitutional:      Appearance: Normal appearance. She is obese.  Neurological:     Mental Status: She is alert.  Psychiatric:        Mood and Affect: Mood normal.        Thought Content: Thought content normal.        Judgment: Judgment normal.      [name], CMA present for exam  Assessment/Plan:   1. Menorrhagia with irregular cycle (Primary) Will change from Tyblume to LoLoestrin to help with bleeding profile. If no improvement after 3 months patient advised to contact office for other options.  - Norethindrone-Ethinyl Estradiol-Fe Biphas (LO LOESTRIN FE) 1 MG-10 MCG / 10 MCG tablet; Take 1 tablet by mouth daily.  Dispense: 84 tablet; Refill: 2   Esmond Hinch B WHNP-BC 10:17 AM 11/08/2023

## 2023-11-11 ENCOUNTER — Ambulatory Visit: Payer: Medicaid Other

## 2023-11-13 ENCOUNTER — Encounter (HOSPITAL_BASED_OUTPATIENT_CLINIC_OR_DEPARTMENT_OTHER): Payer: Self-pay | Admitting: Physical Therapy

## 2023-11-13 ENCOUNTER — Ambulatory Visit (HOSPITAL_BASED_OUTPATIENT_CLINIC_OR_DEPARTMENT_OTHER): Payer: Medicaid Other | Admitting: Physical Therapy

## 2023-11-13 DIAGNOSIS — M6281 Muscle weakness (generalized): Secondary | ICD-10-CM

## 2023-11-13 DIAGNOSIS — M797 Fibromyalgia: Secondary | ICD-10-CM | POA: Diagnosis not present

## 2023-11-13 DIAGNOSIS — R2689 Other abnormalities of gait and mobility: Secondary | ICD-10-CM

## 2023-11-13 NOTE — Therapy (Signed)
 OUTPATIENT PHYSICAL THERAPY LOWER EXTREMITY TREATMENT   Patient Name: Norma Wright MRN: [id number] DOB:2000/11/18, 23 y.o., female Today's Date: 11/13/2023  END OF SESSION:  PT End of Session - 11/13/23 0806     Visit Number 6    Number of Visits 10    Date for PT Re-Evaluation 11/22/23    Authorization Type [place] [employer]    Authorization - Number of Visits 6    PT Start Time 0804    PT Stop Time 0843    PT Time Calculation (min) 39 min    Activity Tolerance Patient tolerated treatment well    Behavior During Therapy WFL for tasks assessed/performed             Past Medical History:  Diagnosis Date   Anemia    Fibromyalgia    Heart murmur    Interstitial cystitis    PID (pelvic inflammatory disease)    History reviewed. No pertinent surgical history. 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   Oral contraceptive use 09/16/2023   Other fatigue 09/02/2023   Left lateral epicondylitis 09/02/2023   Myofascial neck pain 08/05/2023   Interstitial cystitis 08/01/2023   Chronic pain syndrome 07/22/2023   Bilateral headaches 07/22/2023   Adjustment disorder with depressed mood 07/22/2023   Fibromyalgia 06/24/2023   Concussion 06/13/2023   Chronic female pelvic pain 05/17/2023   Dysuria 05/17/2023   Physical deconditioning 07/25/2022   Intermittent palpitations 07/18/2022   Chest pain 07/18/2022   DOE (dyspnea on exertion) 07/18/2022   Dysmenorrhea 07/18/2022   Excessive hair growth 07/18/2022   Obesity (BMI 30-39.9) 07/18/2022   Hot flashes 06/27/2022   Abnormal bruising 06/27/2022   Anxiety 06/27/2022   Positive ANA (antinuclear antibody) 06/19/2022   Sedimentation rate elevation 06/05/2022   Polyarthralgia 06/05/2022   Constipation 03/08/2022   LLQ pain 03/08/2022   Cystic acne 07/31/2021    PCP: Novella Rob NP  REFERRING PROVIDER: Angelina Sheriff, DO   REFERRING DIAG:  M79.7 (ICD-10-CM) - Fibromyalgia   M25.50 (ICD-10-CM) - Polyarthralgia  E66.01 (ICD-10-CM) - Severe obesity (BMI >= 40) (HCC)    THERAPY DIAG:  Fibromyalgia  Muscle weakness (generalized)  Other abnormalities of gait and mobility  Rationale for Evaluation and Treatment: Rehabilitation  ONSET DATE: years  SUBJECTIVE:   SUBJECTIVE STATEMENT: Pt reports "good day".    PERTINENT HISTORY: past medical history of Anemia, Fibromyalgia, Heart murmur, Interstitial cystitis, and PID (pelvic inflammatory disease)   Aquatherapy referral. For fibromyalgia and obesity. Work on gentle mobilization, improvements in endurance, stretching, massage, and gradual increase in cardiovascular exercise regimen for HEP.    PAIN:  Are you having pain? Yes: NPRS scale: current 6/10 Pain location: bilat legs, bilat arms Pain description: ache Aggravating factors: movement/no movement Relieving factors: movement  PRECAUTIONS: None  RED FLAGS: None   WEIGHT BEARING RESTRICTIONS: No  FALLS:  Has patient fallen in last 6 months? No  LIVING ENVIRONMENT:  Lives with: lives with their family  Lives in: House/apartment  Stairs: no  Has following equipment at home: None   OCCUPATION: Proofreader, works Health and safety inspector job   PLOF: Independent   PATIENT GOALS: pain reduction   NEXT MD VISIT: as needed  OBJECTIVE:  Note: Objective measures were completed at Evaluation unless otherwise noted.  PATIENT SURVEYS:  LEFS 23/80  COGNITION: Overall cognitive status: Within functional limits for tasks assessed     SENSATION: WFL   MUSCLE LENGTH: Hamstrings: wfl tested in sitting  PALPATION: Mild TTP throughout paraspinals traps and shoulders Muscle tightness  LOWER EXTREMITY ROM:  Active ROM Right eval Left eval  Hip flexion    Hip extension    Hip abduction    Hip adduction    Hip internal rotation    Hip external rotation    Knee flexion    Knee extension Hyper by ~5d Hyper by ~5d  Ankle dorsiflexion    Ankle  plantarflexion    Ankle inversion    Ankle eversion     (Blank rows = not tested)  LOWER EXTREMITY MMT:  MMT Right eval Left eval  Hip flexion 29.8 36.6  Hip extension    Hip abduction 21.1 24.0  Hip adduction    Hip internal rotation    Hip external rotation    Knee flexion    Knee extension 27.4 26.3  Ankle dorsiflexion    Ankle plantarflexion    Ankle inversion    Ankle eversion     (Blank rows = not tested)  FUNCTIONAL TESTS:  5 times sit to stand: 20.1 Timed up and go (TUG): 12.30  GAIT: Distance walked: 417ft Assistive device utilized: None Level of assistance: Complete Independence Comments: slowed cadence, some knee knocking                                                                                                                                TREATMENT  Pt seen for aquatic therapy today.  Treatment took place in water 3.5-4.75 ft in depth at the [facility] pool. Temp of water was 91.  Pt entered/exited the pool via stairs independently with bilat rail.  - Side Stepping with Hand Floats - Braided Sidestepping with Arms Out   - Standing Hip Abduction Adduction at [facility]  - Standing Hip Flexion March   - Standing Heel Raise with Toes Turned Out   - Standing Hip Flexion Extension at [employer]   - Squat   - Noodle press   - Seated Straddle on Flotation Forward Breast Stroke Arms and Bicycle Legs   - Noodle press - Hand Buoy Carry   Pt requires the buoyancy and hydrostatic pressure of water for support, and to offload joints by unweighting joint load by at least 50 % in navel deep water and by at least 75-80% in chest to neck deep water.  Viscosity of the water is needed for resistance of strengthening. Water current perturbations provides challenge to standing balance requiring increased core activation.  PATIENT EDUCATION:  Education details: intro to aquatic therapy; DOMS expectations Person educated: Patient Education method:  Explanation Education comprehension: verbalized understanding  HOME EXERCISE PROGRAM: Aquatic HEP This aquatic home exercise program from [employer] utilizes pictures from land based exercises, but has been adapted prior to lamination and issuance.   Access Code: [email or web address] URL: [email or web address] Date: 10/21/2023 Prepared by: Geni Bers  This aquatic home exercise program from [employer] utilizes pictures from land based exercises, but  has been adapted prior to lamination and issuance.    Exercises - Side Stepping with Hand Floats  - 1 x daily - 1-3 x weekly - Braided Sidestepping with Arms Out  - 1 x daily - 1-3 x weekly - Standing Hip Abduction Adduction at Pool Wall  - 1 x daily - 1-3 x weekly - 1-2 sets - 10 reps - Standing Hip Flexion March  - 1 x daily - 1-3 x weekly - 1-2 sets - 10 reps - Standing Heel Raise with Toes Turned Out  - 1 x daily - 1-3 x weekly - 1-3 sets - 10 reps - Standing Hip Flexion Extension at [employer]  - 1 x daily - 1-3 x weekly - 1-2 sets - 10 reps - Squat  - 1 x daily - 1-3 x weekly - 1-2 sets - 10 reps - Noodle press  - 1 x daily - 1-3 x weekly - 1-2 sets - 10 reps - Seated Straddle on Flotation Forward Breast Stroke Arms and Bicycle Legs  - 1 x daily - 1-3 x weekly - Noodle press  - 1 x daily - 1-3 x weekly - 1-2 sets - 10 reps - Hand Buoy Carry  - 1 x daily - 1-3 x weekly * Issued  ASSESSMENT:  CLINICAL IMPRESSION: Pt with good toleration to progression in core engagement and balance.  Instructed pt through final HEP.  She vu of progression as well as increasing and decreasing intensity according to pain sensitivity day to day. She plans on gaining pool access this week and is encouraged to run through laminated copy of hep returning for last session for final instruction.  Final testing and DC anticipated next session.       From initial evaluation:  Patient is a 23 y.o. female who was seen today for physical therapy  evaluation and treatment for fibromyalgia.  She reports she has been limited in her functional mobility and toleration to activity for many years. 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  Main complaint today is pain in ue and shoulders. She is TTP throughout upper and lower back with tightness appreciated throughout paraspinals. Of note: she also hyper extends at knees suggesting hypermobility. She does complain of knee pain with standing and walking.  She has had land based PT in recent past with no improvements in pain or mobility including DN. She will benefit from skilled aquatic therapy using the properties of water to improve function, toleration to activity and improve pain management.   OBJECTIVE IMPAIRMENTS: Abnormal gait, decreased activity tolerance, decreased balance, decreased coordination, decreased endurance, difficulty walking, decreased strength, obesity, pain, and hypermobility .   ACTIVITY LIMITATIONS: lifting, bending, sitting, standing, squatting, stairs, and transfers  PARTICIPATION LIMITATIONS: cleaning, shopping, community activity, occupation, and yard work  PERSONAL FACTORS: Age, Fitness, Past/current experiences, Time since onset of injury/illness/exacerbation, and 1-2 comorbidities: see PmHx  are also affecting patient's functional outcome.   REHAB POTENTIAL: Good  CLINICAL DECISION MAKING: Evolving/moderate complexity  EVALUATION COMPLEXITY: Moderate   GOALS: Goals reviewed with patient? Yes  SHORT TERM GOALS: Target date: 10/23/23 Pt will tolerate full aquatic sessions consistently without increase in pain and with improving function to demonstrate good toleration and effectiveness of intervention.  Baseline: Goal status: Met 10/21/23  2.  Pt will report reduction on all pain with submersion to 2/10 or less Baseline: see chart Goal status: In process 11/06/23  3.  Pt will report  awareness and follow through with  avoiding locking knees into extension with standing to decrease knee pain Baseline:  Goal status: Met 10/21/23    LONG TERM GOALS: Target date: 11/22/23  Pt will improve on LEFS by at least 9 points to demonstrate a statistically  significant improvement in function Baseline: 23/80 Goal status: INITIAL  2.  Pt will improve on 5 X STS test to <or=  15s  to demonstrate improving functional lower extremity strength, transitional movements, and balance Baseline: 20.1 Goal status: INITIAL  3.  Pt will improve strength in all areas listed by 10lbs to demonstrate improved overall physical function Baseline: see chart Goal status: INITIAL  4.  Pt will report overall reduction of pain by at least 3 NPRS from all levels of chronic pain max, min and current Baseline: see chart Goal status: INITIAL  5.  Pt will be indep with final Aquatic HEP for continued management of condition along with access to pool. Baseline:  Goal status: INITIAL   PLAN:  PT FREQUENCY: 1-2x/week  PT DURATION: 8 weeks  PLANNED INTERVENTIONS: 97164- PT Re-evaluation, 97110-Therapeutic exercises, 97530- Therapeutic activity, 97112- Neuromuscular re-education, 97535- Self Care, 16109- Manual therapy, 97760- Orthotic Fit/training, [id number]- Aquatic Therapy, [phone]- Ionotophoresis 4mg /ml Dexamethasone, Patient/Family education, Balance training, Stair training, Taping, Dry Needling, Joint mobilization, DME instructions, Cryotherapy, and Moist heat  PLAN FOR NEXT SESSION: aquatics only  Corrie Dandy [place]) Makaria Poarch MPT 11/13/23 8:46 AM [facility] [address] [place], [place], [address] Phone: [phone]   Fax:  [phone]    For all possible CPT codes, reference the Planned Interventions line above.     Check all conditions that are expected to impact treatment: {Conditions expected to impact treatment:Morbid obesity, Musculoskeletal disorders, and Social determinants  of health   If treatment provided at initial evaluation, no treatment charged due to lack of authorization.

## 2023-11-18 ENCOUNTER — Ambulatory Visit (INDEPENDENT_AMBULATORY_CARE_PROVIDER_SITE_OTHER): Payer: Medicaid Other

## 2023-11-18 VITALS — BP 106/73 | HR 85

## 2023-11-18 DIAGNOSIS — R3989 Other symptoms and signs involving the genitourinary system: Secondary | ICD-10-CM | POA: Diagnosis not present

## 2023-11-18 DIAGNOSIS — R350 Frequency of micturition: Secondary | ICD-10-CM | POA: Diagnosis not present

## 2023-11-18 LAB — POCT URINALYSIS DIPSTICK
Blood, UA: NEGATIVE
Glucose, UA: NEGATIVE
Leukocytes, UA: NEGATIVE
Nitrite, UA: NEGATIVE
Protein, UA: POSITIVE — AB
Spec Grav, UA: >=1.03 — AB (ref 1.010–1.025)
Urobilinogen, UA: 1 U/dL
pH, UA: 6 (ref 5.0–8.0)

## 2023-11-18 MED ORDER — SODIUM BICARBONATE 8.4 % IV SOLN
5.0000 mL | Freq: Once | INTRAVENOUS | Status: AC
Start: 2023-11-18 — End: 2023-11-18
  Administered 2023-11-18: 5 mL

## 2023-11-18 MED ORDER — LIDOCAINE HCL 2 % IJ SOLN
20.0000 mL | Freq: Once | INTRAMUSCULAR | Status: AC
Start: 2023-11-18 — End: 2023-11-18
  Administered 2023-11-18: 400 mg

## 2023-11-18 MED ORDER — BUPIVACAINE HCL 0.25 % IJ SOLN
20.0000 mL | Freq: Once | INTRAMUSCULAR | Status: AC
  Administered 2023-11-18: 20 mL

## 2023-11-18 MED ORDER — HEPARIN SODIUM (PORCINE) 10000 UNIT/ML IJ SOLN
10000.0000 [IU] | Freq: Once | INTRAMUSCULAR | Status: AC
  Administered 2023-11-18: 10000 [IU] via INTRAVESICAL

## 2023-11-18 NOTE — Progress Notes (Unsigned)
 Bladder Instillation:   The patient was identified and verbally consented for the procedure.  [name] was unsure if she had symptoms of a UTI, so a point of care urine dip was done in the office.  The urethra was prepped with Betadine x 3. A 16 Fr foley catheter was inserted the bladder and drained for ___0_ cc. The foley was then attached to a 60 mL syringe with the plunger removed.  The medication was slowly poured into the bladder via the syringe and foley.  The medication consisted of: 20ml of Lidocaine 2%, 20mL of Bupivicaine 0.25%, 10,000 units/mL Heparin, 5mL Sodium Bicarbonate 8.4%.  The foley was removed and the patient was asked to hold the liquids in her bladder for 30-60 minutes if possible.   Precautions were given and patient was instructed to call the office or on-call number for any concerns.  Norma Wright, CMA

## 2023-11-18 NOTE — Patient Instructions (Signed)
 Please keep all scheduled appointments.   It was a pleasure to see you today!  Thank you for trusting me with your care!

## 2023-11-20 ENCOUNTER — Encounter (HOSPITAL_BASED_OUTPATIENT_CLINIC_OR_DEPARTMENT_OTHER): Payer: Self-pay | Admitting: Physical Therapy

## 2023-11-20 ENCOUNTER — Ambulatory Visit (HOSPITAL_BASED_OUTPATIENT_CLINIC_OR_DEPARTMENT_OTHER): Payer: Medicaid Other | Attending: Physical Medicine and Rehabilitation | Admitting: Physical Therapy

## 2023-11-20 DIAGNOSIS — M6281 Muscle weakness (generalized): Secondary | ICD-10-CM | POA: Insufficient documentation

## 2023-11-20 DIAGNOSIS — M797 Fibromyalgia: Secondary | ICD-10-CM | POA: Insufficient documentation

## 2023-11-20 DIAGNOSIS — R2689 Other abnormalities of gait and mobility: Secondary | ICD-10-CM | POA: Insufficient documentation

## 2023-11-20 NOTE — Therapy (Signed)
 OUTPATIENT PHYSICAL THERAPY LOWER EXTREMITY TREATMENT PHYSICAL THERAPY DISCHARGE SUMMARY  Visits from Start of Care: 7  Current functional level related to goals / functional outcomes: indep   Remaining deficits: Chronic pain   Education / Equipment: Management of condition/ HEP   Patient agrees to discharge. Patient goals were partially met. Patient is being discharged due to maximized rehab potential.    Patient Name: Norma Wright MRN: [id number] DOB:2000-12-27, 23 y.o., female Today's Date: 11/20/2023  END OF SESSION:  PT End of Session - 11/20/23 0805     Visit Number 7    Number of Visits 10    Date for PT Re-Evaluation 11/22/23    Authorization Type [place] [employer] medicaid    Authorization - Number of Visits 6    PT Start Time 0801    PT Stop Time 0840    PT Time Calculation (min) 39 min    Activity Tolerance Patient tolerated treatment well    Behavior During Therapy WFL for tasks assessed/performed             Past Medical History:  Diagnosis Date   Anemia    Fibromyalgia    Heart murmur    Interstitial cystitis    PID (pelvic inflammatory disease)    History reviewed. No pertinent surgical history. 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   Oral contraceptive use 09/16/2023   Other fatigue 09/02/2023   Left lateral epicondylitis 09/02/2023   Myofascial neck pain 08/05/2023   Interstitial cystitis 08/01/2023   Chronic pain syndrome 07/22/2023   Bilateral headaches 07/22/2023   Adjustment disorder with depressed mood 07/22/2023   Fibromyalgia 06/24/2023   Concussion 06/13/2023   Chronic female pelvic pain 05/17/2023   Dysuria 05/17/2023   Physical deconditioning 07/25/2022   Intermittent palpitations 07/18/2022   Chest pain 07/18/2022   DOE (dyspnea on exertion) 07/18/2022   Dysmenorrhea 07/18/2022   Excessive hair growth 07/18/2022   Obesity (BMI 30-39.9) 07/18/2022   Hot flashes 06/27/2022   Abnormal  bruising 06/27/2022   Anxiety 06/27/2022   Positive ANA (antinuclear antibody) 06/19/2022   Sedimentation rate elevation 06/05/2022   Polyarthralgia 06/05/2022   Constipation 03/08/2022   LLQ pain 03/08/2022   Cystic acne 07/31/2021    PCP: Novella Rob NP  REFERRING PROVIDER: Angelina Sheriff, DO   REFERRING DIAG:  M79.7 (ICD-10-CM) - Fibromyalgia  M25.50 (ICD-10-CM) - Polyarthralgia  E66.01 (ICD-10-CM) - Severe obesity (BMI >= 40) (HCC)    THERAPY DIAG:  Fibromyalgia  Muscle weakness (generalized)  Other abnormalities of gait and mobility  Rationale for Evaluation and Treatment: Rehabilitation  ONSET DATE: years  SUBJECTIVE:   SUBJECTIVE STATEMENT: Became member of [employer].    PERTINENT HISTORY: past medical history of Anemia, Fibromyalgia, Heart murmur, Interstitial cystitis, and PID (pelvic inflammatory disease)   Aquatherapy referral. For fibromyalgia and obesity. Work on gentle mobilization, improvements in endurance, stretching, massage, and gradual increase in cardiovascular exercise regimen for HEP.    PAIN:  Are you having pain? Yes: NPRS scale: current 5/10 Pain location: bilat legs, bilat arms Pain description: ache Aggravating factors: movement/no movement Relieving factors: movement  PRECAUTIONS: None  RED FLAGS: None   WEIGHT BEARING RESTRICTIONS: No  FALLS:  Has patient fallen in last 6 months? No  LIVING ENVIRONMENT:  Lives with: lives with their family  Lives in: House/apartment  Stairs: no  Has following equipment at home: None   OCCUPATION: Proofreader, works Health and safety inspector job   PLOF: Independent  PATIENT GOALS: pain reduction   NEXT MD VISIT: as needed  OBJECTIVE:  Note: Objective measures were completed at Evaluation unless otherwise noted.  PATIENT SURVEYS:  LEFS 23/80  11/20/23: 45/80 COGNITION: Overall cognitive status: Within functional limits for tasks assessed     SENSATION: WFL   MUSCLE  LENGTH: Hamstrings: wfl tested in sitting   PALPATION: Mild TTP throughout paraspinals traps and shoulders Muscle tightness  LOWER EXTREMITY ROM:  Active ROM Right eval Left eval  Hip flexion    Hip extension    Hip abduction    Hip adduction    Hip internal rotation    Hip external rotation    Knee flexion    Knee extension Hyper by ~5d Hyper by ~5d  Ankle dorsiflexion    Ankle plantarflexion    Ankle inversion    Ankle eversion     (Blank rows = not tested)  LOWER EXTREMITY MMT:  MMT Right eval Left eval R / L 3/5  Hip flexion 29.8 36.6 64.3 / 59.2  Hip extension     Hip abduction 21.1 24.0 22.0 / 29.1  Hip adduction     Hip internal rotation     Hip external rotation     Knee flexion     Knee extension 27.4 26.3   Ankle dorsiflexion     Ankle plantarflexion     Ankle inversion     Ankle eversion      (Blank rows = not tested)  FUNCTIONAL TESTS:  5 times sit to stand: 20.1 Timed up and go (TUG): 12.30  11/20/23: 5 X STS:14.99   GAIT: Distance walked: 414ft Assistive device utilized: None Level of assistance: Complete Independence Comments: slowed cadence, some knee knocking                                                                                                                                TREATMENT  Pt seen for aquatic therapy today.  Treatment took place in water 3.5-4.75 ft in depth at the [facility] pool. Temp of water was 91.  Pt entered/exited the pool via stairs independently with bilat rail.  - Side Stepping with Hand Floats - Braided Sidestepping with Arms Out   - Standing Hip Abduction Adduction at [facility]  - Standing Hip Flexion March   - Standing Heel Raise with Toes Turned Out   - Standing Hip Flexion Extension at [employer]   - Squat   - Noodle press   - Seated Straddle on Flotation Forward Breast Stroke Arms and Bicycle Legs   - Noodle press - Hand Buoy Carry   Pt requires the buoyancy and hydrostatic  pressure of water for support, and to offload joints by unweighting joint load by at least 50 % in navel deep water and by at least 75-80% in chest to neck deep water.  Viscosity of the water is needed for resistance of strengthening. Water current perturbations provides  challenge to standing balance requiring increased core activation.  PATIENT EDUCATION:  Education details: intro to aquatic therapy; DOMS expectations Person educated: Patient Education method: Explanation Education comprehension: verbalized understanding  HOME EXERCISE PROGRAM: Aquatic HEP This aquatic home exercise program from [employer] utilizes pictures from land based exercises, but has been adapted prior to lamination and issuance.   Access Code: [email or web address] URL: [email or web address] Date: 10/21/2023 Prepared by: Geni Bers  This aquatic home exercise program from [employer] utilizes pictures from land based exercises, but has been adapted prior to lamination and issuance.    Exercises - Side Stepping with Hand Floats  - 1 x daily - 1-3 x weekly - Braided Sidestepping with Arms Out  - 1 x daily - 1-3 x weekly - Standing Hip Abduction Adduction at Pool Wall  - 1 x daily - 1-3 x weekly - 1-2 sets - 10 reps - Standing Hip Flexion March  - 1 x daily - 1-3 x weekly - 1-2 sets - 10 reps - Standing Heel Raise with Toes Turned Out  - 1 x daily - 1-3 x weekly - 1-3 sets - 10 reps - Standing Hip Flexion Extension at [employer]  - 1 x daily - 1-3 x weekly - 1-2 sets - 10 reps - Squat  - 1 x daily - 1-3 x weekly - 1-2 sets - 10 reps - Noodle press  - 1 x daily - 1-3 x weekly - 1-2 sets - 10 reps - Seated Straddle on Flotation Forward Breast Stroke Arms and Bicycle Legs  - 1 x daily - 1-3 x weekly - Noodle press  - 1 x daily - 1-3 x weekly - 1-2 sets - 10 reps - Hand Buoy Carry  - 1 x daily - 1-3 x weekly * Issued  ASSESSMENT:  CLINICAL IMPRESSION: Pt now has membership to [facility] for continuation of aquatic  hep.  She completes entire Hep today with minor VC and clarifications. She is instructed on techniques to increase and/or decrease challenges dependant on her pain level. Final testing completed.  Pt has reached her max potential with therapy.  She has not met her pain goals as her chronic condition remains.  Overall frequency of high pain levels have decreased with reported 40% overall decrease in pain, but continues. Her hip flex strength significantly has improved although she continues to have weakness in her abd as noted in chart.  She has reached her max potential in setting.       From initial evaluation:  Patient is a 23 y.o. female who was seen today for physical therapy evaluation and treatment for fibromyalgia.  She reports she has been limited in her functional mobility and toleration to activity for many years. 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  Main complaint today is pain in ue and shoulders. She is TTP throughout upper and lower back with tightness appreciated throughout paraspinals. Of note: she also hyper extends at knees suggesting hypermobility. She does complain of knee pain with standing and walking.  She has had land based PT in recent past with no improvements in pain or mobility including DN. She will benefit from skilled aquatic therapy using the properties of water to improve function, toleration to activity and improve pain management.   OBJECTIVE IMPAIRMENTS: Abnormal gait, decreased activity tolerance, decreased balance, decreased coordination, decreased endurance, difficulty walking, decreased strength, obesity, pain, and hypermobility .   ACTIVITY LIMITATIONS: lifting, bending, sitting, standing, squatting, stairs,  and transfers  PARTICIPATION LIMITATIONS: cleaning, shopping, community activity, occupation, and yard work  PERSONAL FACTORS: Age, Fitness, Past/current experiences, Time since onset of  injury/illness/exacerbation, and 1-2 comorbidities: see PmHx  are also affecting patient's functional outcome.   REHAB POTENTIAL: Good  CLINICAL DECISION MAKING: Evolving/moderate complexity  EVALUATION COMPLEXITY: Moderate   GOALS: Goals reviewed with patient? Yes  SHORT TERM GOALS: Target date: 10/23/23 Pt will tolerate full aquatic sessions consistently without increase in pain and with improving function to demonstrate good toleration and effectiveness of intervention.  Baseline: Goal status: Met 10/21/23  2.  Pt will report reduction on all pain with submersion to 2/10 or less Baseline: see chart Goal status: In process 11/06/23; pain submerged 2/10 or < 11/20/23  3.  Pt will report awareness and follow through with avoiding locking knees into extension with standing to decrease knee pain Baseline:  Goal status: Met 10/21/23    LONG TERM GOALS: Target date: 11/22/23  Pt will improve on LEFS by at least 9 points to demonstrate a statistically  significant improvement in function Baseline: 23/80 Goal status: goal exceeded 45/90  2.  Pt will improve on 5 X STS test to <or=  15s  to demonstrate improving functional lower extremity strength, transitional movements, and balance Baseline: 20.1 Goal status: Met 11/20/23  3.  Pt will improve strength in all areas listed by 10lbs to demonstrate improved overall physical function Baseline: see chart Goal status: Partially met 11/19/33  4.  Pt will report overall reduction of pain by at least 3 NPRS from all levels of chronic pain max, min and current Baseline: see chart Goal status: Improved but not met 11/20/23  5.  Pt will be indep with final Aquatic HEP for continued management of condition along with access to pool. Baseline:  Goal status: Met 11/20/23   PLAN:  PT FREQUENCY: 1-2x/week  PT DURATION: 8 weeks  PLANNED INTERVENTIONS: 97164- PT Re-evaluation, 97110-Therapeutic exercises, 97530- Therapeutic activity, 97112- Neuromuscular  re-education, 97535- Self Care, 40981- Manual therapy, 97760- Orthotic Fit/training, [id number]- Aquatic Therapy, [phone]- Ionotophoresis 4mg /ml Dexamethasone, Patient/Family education, Balance training, Stair training, Taping, Dry Needling, Joint mobilization, DME instructions, Cryotherapy, and Moist heat  PLAN FOR NEXT SESSION: aquatics only  Corrie Dandy Gaston) Keanu Frickey MPT 11/20/23 9:48 AM [facility] [address] [place], [place], [address] Phone: [phone]   Fax:  [phone]    For all possible CPT codes, reference the Planned Interventions line above.     Check all conditions that are expected to impact treatment: {Conditions expected to impact treatment:Morbid obesity, Musculoskeletal disorders, and Social determinants of health   If treatment provided at initial evaluation, no treatment charged due to lack of authorization.

## 2023-11-25 ENCOUNTER — Ambulatory Visit (INDEPENDENT_AMBULATORY_CARE_PROVIDER_SITE_OTHER): Payer: Medicaid Other

## 2023-11-25 VITALS — BP 99/59 | HR 76

## 2023-11-25 DIAGNOSIS — R3989 Other symptoms and signs involving the genitourinary system: Secondary | ICD-10-CM

## 2023-11-25 MED ORDER — BUPIVACAINE HCL 0.25 % IJ SOLN
20.0000 mL | Freq: Once | INTRAMUSCULAR | Status: AC
  Administered 2023-11-25: 20 mL

## 2023-11-25 MED ORDER — HEPARIN SODIUM (PORCINE) 10000 UNIT/ML IJ SOLN
10000.0000 [IU] | Freq: Once | INTRAMUSCULAR | Status: AC
  Administered 2023-11-25: 10000 [IU] via INTRAVESICAL

## 2023-11-25 MED ORDER — LIDOCAINE HCL 2 % IJ SOLN
20.0000 mL | Freq: Once | INTRAMUSCULAR | Status: AC
  Administered 2023-11-25: 400 mg

## 2023-11-25 MED ORDER — SODIUM BICARBONATE 8.4 % IV SOLN
5.0000 mL | Freq: Once | INTRAVENOUS | Status: AC
  Administered 2023-11-25: 5 mL

## 2023-11-25 NOTE — Progress Notes (Signed)
 Bladder Instillation: The patient was identified and verbally consented for the procedure.  The urethra was prepped with Betadine x 3. A 16 Fr foley catheter was inserted the bladder and drained for __25__ cc. The foley was then attached to a 60 mL syringe with the plunger removed.  The medication was slowly poured into the bladder via the syringe and foley.  The medication consisted of: 20ml of Lidocaine 2%, 20mL of Bupivicaine 0.25%, 10,000 units/mL Heparin, 5mL Sodium Bicarbonate 8.4%.  The foley was removed and the patient was asked to hold the liquids in her bladder for 30-60 minutes if possible.   Precautions were given and patient was instructed to call the office or on-call number for any concerns.  Sammuel Cooper, CMA

## 2023-11-25 NOTE — Patient Instructions (Signed)
 Keep any up coming appointments.

## 2023-11-26 ENCOUNTER — Encounter: Payer: Self-pay | Admitting: Nurse Practitioner

## 2023-11-26 ENCOUNTER — Other Ambulatory Visit: Payer: Self-pay | Admitting: Physical Medicine and Rehabilitation

## 2023-11-27 MED ORDER — WEGOVY 1 MG/0.5ML ~~LOC~~ SOAJ: 1.0000 mg | SUBCUTANEOUS | 0 refills | Status: DC

## 2023-11-27 NOTE — Telephone Encounter (Signed)
 Requesting: [id number] Last Visit: 09/16/2023 Next Visit: 12/13/2023 Last Refill: 10/25/2023  Please Advise

## 2023-12-02 ENCOUNTER — Ambulatory Visit (INDEPENDENT_AMBULATORY_CARE_PROVIDER_SITE_OTHER): Payer: Medicaid Other

## 2023-12-02 ENCOUNTER — Encounter
Payer: Medicaid Other | Attending: Physical Medicine and Rehabilitation | Admitting: Physical Medicine and Rehabilitation

## 2023-12-02 VITALS — BP 105/81 | HR 85

## 2023-12-02 VITALS — BP 113/76 | HR 74 | Ht 67.0 in | Wt 226.0 lb

## 2023-12-02 DIAGNOSIS — M2550 Pain in unspecified joint: Secondary | ICD-10-CM | POA: Diagnosis present

## 2023-12-02 DIAGNOSIS — F411 Generalized anxiety disorder: Secondary | ICD-10-CM | POA: Diagnosis present

## 2023-12-02 DIAGNOSIS — G894 Chronic pain syndrome: Secondary | ICD-10-CM | POA: Diagnosis not present

## 2023-12-02 DIAGNOSIS — M797 Fibromyalgia: Secondary | ICD-10-CM | POA: Diagnosis not present

## 2023-12-02 DIAGNOSIS — R3989 Other symptoms and signs involving the genitourinary system: Secondary | ICD-10-CM

## 2023-12-02 DIAGNOSIS — F5101 Primary insomnia: Secondary | ICD-10-CM | POA: Diagnosis present

## 2023-12-02 MED ORDER — SODIUM BICARBONATE 8.4 % IV SOLN
5.0000 mL | Freq: Once | INTRAVENOUS | Status: AC
  Administered 2023-12-02: 5 mL

## 2023-12-02 MED ORDER — HEPARIN SODIUM (PORCINE) 10000 UNIT/ML IJ SOLN
10000.0000 [IU] | Freq: Once | INTRAMUSCULAR | Status: AC
  Administered 2023-12-02: 10000 [IU] via INTRAVESICAL

## 2023-12-02 MED ORDER — BUPIVACAINE HCL 0.25 % IJ SOLN
20.0000 mL | Freq: Once | INTRAMUSCULAR | Status: AC
  Administered 2023-12-02: 20 mL

## 2023-12-02 MED ORDER — LIDOCAINE HCL 2 % IJ SOLN
20.0000 mL | Freq: Once | INTRAMUSCULAR | Status: AC
  Administered 2023-12-02: 400 mg

## 2023-12-02 MED ORDER — PREGABALIN 50 MG PO CAPS: ORAL_CAPSULE | ORAL | 2 refills | Status: DC

## 2023-12-02 NOTE — Progress Notes (Addendum)
 Subjective:    Patient ID: Norma Wright, female    DOB: 2000/10/21, 21 y.o.   MRN: [id number]  HPI   Norma Wright is a 23 y.o. year old female  who  has a past medical history of Anemia, Fibromyalgia, Heart murmur, Interstitial cystitis, and PID (pelvic inflammatory disease).   They are presenting to PM&R clinic as a follow up for treatment of pain related to chronic headaches, neck pain, and fibromyalgia (pain in bilateral hands to forearms, knees to shins, neck and low back) , complicated by concussion 05-2023 (symptoms now mostly resolved)..   Plan from last visit:  Chronic pain syndrome Fibromyalgia Myofascial neck pain Increase duloxetine from 60 mg daily to 60 mg twice daily.  I would continue this dose of the medication for at least 3 to 4 weeks before knowing if it will be effective.  If you start experiencing side effects or wish to come off the medication, do a gradual taper removing 1 tablet per week to avoid side effects.     Please call clinic if you have any questions or concerns, or message me through MyChart.  I would like to hear from you in 2 weeks regardless to know the effects of the medication.   Follow-up in 3 months.   Polyarthralgia -     Ambulatory referral to Physical Therapy   For neck and shoulder pain, along with pain in your left elbow, get Voltaren gel over-the-counter and apply it to these areas at least twice daily, with a max of 4 times daily.  You can also continue to use over-the-counter Salonpas or Bengay patches for pain.     Left lateral epicondylitis For your left elbow, you can obtain an over-the-counter brace to help offload the tendons in your wrist; I would use this alongside activity modification by reducing the number of activities that strain your left wrist for at least 2 weeks.     Severe obesity (BMI >= 40) (HCC) Other fatigue -     Ambulatory referral to Sleep Studies Finally, I am sending you to sleep medicine for a home sleep  study to evaluate for sleep apnea.     Interval Hx:  - Therapies: Discharged from PT's 3-5, with plan for use of [employer] membership for continued aquatic HEP.  Did report overall frequency of hide pain levels to decrease 40%, with pain continued.  Hip flexor strength significantly improved, although she continues to have significant weakness in her abductors.  - She is now getting to the [facility] twice per week. Feels she is benefiting from this.    - Follow ups: [facility] a rheumatology [facility]; "they said they were going to discuss everything with me at my appointment in March, but everything points toward fibromyalgia and IC".   Sleep clinic follow up? - Never heard from them. She says "the last few weeks has been kind of bad because I have been in a lot of pain"; primarily in her knees and hands, arms. Some in her back. She says pain is worse at nighttime.    - Falls: none   - DME: Used the tennis elbow brace for 2 weeks then stopped; still having some pain on the left but now more in the shoulder than the forearm.    - Medications: Cymbalta 60 mg BID? - She stopped because "I felt like it wasn't working". She has not noticed any side effects and no effect on her pain since stopping.  For pain currently, she is using a SalonPas on her shoulders and back, with benefit.   She is getting lidocaine shots into her bladder every week for IC, she says this is benefitting her significantly with bladder pain.   Recently changed BC to target menorrhagia.   She is no longer using Naproxen because headaches improved; she stopped per recommendations.   She is now off trazodone as well "because they said they didn't want it to be long term".   Does not wish to resume gabapentin due to weight gain.    - Other concerns: none   Pain Inventory Average Pain 7 Pain Right Now 6 My pain is burning and dull  In the last 24 hours, has pain interfered with the following? General  activity 7 Relation with others 0 Enjoyment of life 6 What TIME of day is your pain at its worst? morning , daytime, evening, and night Sleep (in general) Poor  Pain is worse with: walking, standing, and some activites Pain improves with: heat/ice and therapy/exercise Relief from Meds:  na  Family History  Problem Relation Age of Onset   Heart Problems Mother    Asthma Sister    Kidney disease Maternal Grandfather    Asthma Maternal Uncle    Bladder Cancer Neg Hx    Uterine cancer Neg Hx    Social History   Socioeconomic History   Marital status: Single    Spouse name: Not on file   Number of children: Not on file   Years of education: Not on file   Highest education level: GED or equivalent  Occupational History   Not on file  Tobacco Use   Smoking status: Never    Passive exposure: Never   Smokeless tobacco: Never  Vaping Use   Vaping status: Never Used  Substance and Sexual Activity   Alcohol use: No   Drug use: Never   Sexual activity: Never    Partners: Male    Comment: menarche 14yo, never sexually active  Other Topics Concern   Not on file  Social History Narrative   Not on file   Social Drivers of Health   Financial Resource Strain: Medium Risk (10/30/2023)   Received from [facility]   Overall Financial Resource Strain (CARDIA)    Difficulty of Paying Living Expenses: Somewhat hard  Food Insecurity: No Food Insecurity (10/30/2023)   Received from [facility]   Hunger Vital Sign    Worried About Running Out of Food in the Last Year: Never true    Ran Out of Food in the Last Year: Never true  Recent Concern: Food Insecurity - Food Insecurity Present (09/14/2023)   Hunger Vital Sign    Worried About Running Out of Food in the Last Year: Sometimes true    Ran Out of Food in the Last Year: Never true  Transportation Needs: No Transportation Needs (10/30/2023)   Received from [facility]   PRAPARE -  Transportation    In the past 12 months, has lack of transportation kept you from medical appointments or from getting medications?: No    Lack of Transportation (Non-Medical): No  Physical Activity: Insufficiently Active (09/14/2023)   Exercise Vital Sign    Days of Exercise per Week: 3 days    Minutes of Exercise per Session: 10 min  Stress: No Stress Concern Present (09/14/2023)   [employer] of Occupational Health - Occupational Stress Questionnaire    Feeling of Stress :  Only a little  Recent Concern: Stress - Stress Concern Present (06/21/2023)   [employer] of Occupational Health - Occupational Stress Questionnaire    Feeling of Stress : To some extent  Social Connections: Socially Isolated (09/14/2023)   Social Connection and Isolation Panel [NHANES]    Frequency of Communication with Friends and Family: Twice a week    Frequency of Social Gatherings with Friends and Family: Twice a week    Attends Religious Services: Never    Database administrator or Organizations: No    Attends Engineer, structural: Not on file    Marital Status: Never married   No past surgical history on file. No past surgical history on file. Past Medical History:  Diagnosis Date   Anemia    Fibromyalgia    Heart murmur    Interstitial cystitis    PID (pelvic inflammatory disease)    BP 113/76   Pulse 74   Ht 5\' 7"  (1.702 m)   Wt 226 lb (102.5 kg)   LMP 11/06/2023 (Exact Date)   SpO2 100%   BMI 35.40 kg/m   Opioid Risk Score:   Fall Risk Score:  `1  Depression screen PHQ 2/9     08/05/2023    1:50 PM 07/22/2023   10:35 AM 06/13/2023    3:05 PM 05/17/2023    4:15 PM 04/22/2023    8:46 AM 07/18/2022    9:47 AM  Depression screen PHQ 2/9  Decreased Interest 0 3 1 0 0 3  Down, Depressed, Hopeless 0 2 1 0 0 3  PHQ - 2 Score 0 5 2 0 0 6  Altered sleeping  3 3   3   Tired, decreased energy  3 3   3   Change in appetite  3 1   3   Feeling bad or failure about yourself   0  0   2  Trouble concentrating  2 3   3   Moving slowly or fidgety/restless  0 0   0  Suicidal thoughts  0 0   0  PHQ-9 Score  16 12   20   Difficult doing work/chores   Somewhat difficult         Review of Systems  Musculoskeletal:        Bilateral arm, hands and knee pain  All other systems reviewed and are negative.     Objective:   Physical Exam   PE: Constitution: Appropriate appearance for age. No apparent distress +Obese Resp: No respiratory distress. No accessory muscle usage. on RA and CTAB Cardio: Well perfused appearance. No peripheral edema. Abdomen: Nondistended. Nontender.   Psych: Appropriate mood and affect. Neuro: AAOx4. No apparent cognitive deficits    Neurologic Exam:   DTRs: Reflexes were 2+ in bilateral achilles, patella, biceps, BR and triceps. Babinsky: flexor responses b/l.   Hoffmans: negative b/l Sensory exam: revealed normal sensation in all dermatomal regions in bilateral upper extremities and bilateral lower extremities Motor exam: strength 5-/5 throughout bilateral upper extremities and bilateral lower extremities Coordination: Fine motor coordination was normal.   Gait: Normal; less antalgic than prior   MSK Exam: Back Inspection: Shoulder girdles were  even. The cervical lordotic curvature was  WNL.  Palpatory exam: revealed ttp at the bilateral cervical, thoracic  and lumbar paraspinals, PSIS, trapezius  There was no muscle spasm.  Full AROM.   MSK: L shoulder + TTP trapezius, deltoid, biceps, medial epicondyle. Full strength and AROM. No apparent deformity.  Assessment & Plan:   Keon Benscoter is a 23 y.o. year old female  who  has a past medical history of Anemia, Fibromyalgia, Heart murmur, Interstitial cystitis, and PID (pelvic inflammatory disease).   hey are presenting to PM&R clinic as a follow up for treatment of pain related to  fibromyalgia (pain in bilateral hands to forearms, knees to shins, neck and low back) ,  complicated by concussion 05-2023 (symptoms now mostly resolved).  Chronic pain syndrom Fibromyalgia Polyarthralgia  Start Lyrica for fibromyalgia pain at bedtime to help control symptoms overnight and improve your sleep.  Start with 150 mg capsule at nighttime for 1 week, then if well-tolerated without side effects, increase to 100 mg at nighttime.  Message me or call me within 2 weeks to let me know regarding any potential good or ill effects.  Patient has failed gabapentin and duloxetine due to no effect.  We talked briefly today about the minalcipran and low-dose naltrexone as alternatives.  Will try Lyrica first as above.  For acute pain flares, it is okay to use naproxen or ibuprofen over-the-counter for few days at a time.  Discussed risks of prolonged daily use including peptic ulcer disease, renal disease, and rebound headaches.  Continue twice weekly pool exercises as recommended by your aqua therapist.  We talked a lot today about energy conservation and fibromyalgia, and the need to balance maintaining aerobic exercise without overdoing it and causing pain exacerbations.  Patient is understanding.   Follow-up with rheumatology as instructed and with me in 3 months.  Primary insomnia  Sleep study referral last time was not approved/scheduled for appointment.  Will follow-up with referral regarding why this was not done.    - Referral denial reviewed: Thank you for the referral to [facility]. We have reviewed the referral for your patient  Aissatou Fronczak and our providers have declined to see the patient. After review it was found the patient completed a sleep study with [facility] in June 2024. Because this is within the last 3 years we are unable to see the patient in our office. Our providers recommend the patient follow up with the previous sleep care provider with any new concerns or ongoing care.     Anxiety state  Agree with assessment  by your primary care doctor for medications for anxiety; I will send them our note today to make sure they know it is something we discussed.  I would still call yourself.   Other orders -     Pregabalin; Take 1 capsule (50 mg total) by mouth at bedtime for 7 days, THEN 2 capsules (100 mg total) at bedtime for 23 days.  Dispense: 60 capsule; Refill: 2

## 2023-12-02 NOTE — Patient Instructions (Signed)
 Keep any future follow up appointments.

## 2023-12-02 NOTE — Patient Instructions (Addendum)
 Start Lyrica for fibromyalgia pain at bedtime to help control symptoms overnight and improve your sleep.  Start with 150 mg capsule at nighttime for 1 week, then if well-tolerated without side effects, increase to 100 mg at nighttime.  Message me or call me within 2 weeks to let me know regarding any potential good or ill effects.  Patient has failed gabapentin and duloxetine due to no effect.  We talked briefly today about the minalcipran and low-dose naltrexone as alternatives.  Will try Lyrica first as above.  For acute pain flares, it is okay to use naproxen or ibuprofen over-the-counter for few days at a time.  Discussed risks of prolonged daily use including peptic ulcer disease, renal disease, and rebound headaches.  Continue twice weekly pool exercises as recommended by your aqua therapist.  We talked a lot today about energy conservation and fibromyalgia, and the need to balance maintaining aerobic exercise without overdoing it and causing pain exacerbations.  Patient is understanding.  Sleep study referral last time was not approved/scheduled for appointment.  Will follow-up with referral regarding why this was not done.  Agree with assessment by your primary care doctor for medications for anxiety; I will send them our note today to make sure they know it is something we discussed.  I would still call yourself.  Follow-up with rheumatology as instructed and with me in 3 months.

## 2023-12-02 NOTE — Progress Notes (Signed)
 Norma Wright is a 23 y.o. female here for a bladder instillation. Pt was cleaned with Hibiclens Bladder was drained for 15 using a 49fr catheter. Instillation was given to pt with no complications.

## 2023-12-09 ENCOUNTER — Ambulatory Visit: Payer: Medicaid Other

## 2023-12-13 ENCOUNTER — Encounter: Payer: Self-pay | Admitting: Nurse Practitioner

## 2023-12-13 ENCOUNTER — Ambulatory Visit: Payer: Medicaid Other | Admitting: Nurse Practitioner

## 2023-12-13 VITALS — BP 108/60 | HR 69 | Temp 97.6°F | Ht 67.0 in | Wt 223.4 lb

## 2023-12-13 DIAGNOSIS — M797 Fibromyalgia: Secondary | ICD-10-CM | POA: Diagnosis not present

## 2023-12-13 DIAGNOSIS — F411 Generalized anxiety disorder: Secondary | ICD-10-CM | POA: Diagnosis not present

## 2023-12-13 DIAGNOSIS — E669 Obesity, unspecified: Secondary | ICD-10-CM

## 2023-12-13 DIAGNOSIS — E538 Deficiency of other specified B group vitamins: Secondary | ICD-10-CM

## 2023-12-13 DIAGNOSIS — E559 Vitamin D deficiency, unspecified: Secondary | ICD-10-CM

## 2023-12-13 DIAGNOSIS — E611 Iron deficiency: Secondary | ICD-10-CM | POA: Diagnosis not present

## 2023-12-13 LAB — VITAMIN D 25 HYDROXY (VIT D DEFICIENCY, FRACTURES): VITD: 22.27 ng/mL — ABNORMAL LOW (ref 30.00–100.00)

## 2023-12-13 LAB — CBC WITH DIFFERENTIAL/PLATELET
Basophils Absolute: 0 10*3/uL (ref 0.0–0.1)
Basophils Relative: 0.6 % (ref 0.0–3.0)
Eosinophils Absolute: 0.1 10*3/uL (ref 0.0–0.7)
Eosinophils Relative: 2.2 % (ref 0.0–5.0)
HCT: 40.5 % (ref 36.0–46.0)
Hemoglobin: 13.6 g/dL (ref 12.0–15.0)
Lymphocytes Relative: 36.4 % (ref 12.0–46.0)
Lymphs Abs: 1.7 10*3/uL (ref 0.7–4.0)
MCHC: 33.5 g/dL (ref 30.0–36.0)
MCV: 91.2 fl (ref 78.0–100.0)
Monocytes Absolute: 0.5 10*3/uL (ref 0.1–1.0)
Monocytes Relative: 10.1 % (ref 3.0–12.0)
Neutro Abs: 2.4 10*3/uL (ref 1.4–7.7)
Neutrophils Relative %: 50.7 % (ref 43.0–77.0)
Platelets: 329 10*3/uL (ref 150.0–400.0)
RBC: 4.45 Mil/uL (ref 3.87–5.11)
RDW: 14.4 % (ref 11.5–14.6)
WBC: 4.8 10*3/uL (ref 4.5–10.5)

## 2023-12-13 LAB — FERRITIN: Ferritin: 9.5 ng/mL — ABNORMAL LOW (ref 10.0–291.0)

## 2023-12-13 LAB — VITAMIN B12: Vitamin B-12: 185 pg/mL — ABNORMAL LOW (ref 211–911)

## 2023-12-13 LAB — TSH: TSH: 1.81 u[IU]/mL (ref 0.35–5.50)

## 2023-12-13 MED ORDER — WEGOVY 1.7 MG/0.75ML ~~LOC~~ SOAJ: 1.7000 mg | SUBCUTANEOUS | 2 refills | Status: DC

## 2023-12-13 MED ORDER — SERTRALINE HCL 25 MG PO TABS: 25.0000 mg | ORAL_TABLET | Freq: Every day | ORAL | 1 refills | Status: DC

## 2023-12-13 NOTE — Assessment & Plan Note (Signed)
 She is currently taking an OTC vitamin D supplement daily. Will check D levels and adjust regimen based on results. She recently misplaced her prescription vitamin D and can't get it refilled until May.

## 2023-12-13 NOTE — Assessment & Plan Note (Signed)
 She lost 17 pounds on Wegovy with manageable nausea. Currently on 1 mg, plans to increase to 1.7 mg. Physical activity aids chronic pain. Increase Wegovy dose to 1.7 mg and continue physical activity at the [facility].

## 2023-12-13 NOTE — Assessment & Plan Note (Signed)
 Chronic pain managed with pregabalin. Refer to [facility] for pain management.

## 2023-12-13 NOTE — Assessment & Plan Note (Signed)
 She is currently taking an OTC vitamin B12 supplement daily. Will check B12 levels and adjust regimen based on results.

## 2023-12-13 NOTE — Patient Instructions (Signed)
 It was great to see you!  We are checking your labs today and will let you know the results via mychart/phone.   Start zoloft 1 tablet daily for your anxiety  I have placed a referral to therapy   Increase your wegovy to 1.7mg  weekly   Keep up the great work!  Let's follow-up in 6-8 weeks, sooner if you have concerns.  If a referral was placed today, you will be contacted for an appointment. Please note that routine referrals can sometimes take up to 3-4 weeks to process. Please call our office if you haven't heard anything after this time frame.  Take care,  Rodman Pickle, NP

## 2023-12-13 NOTE — Progress Notes (Signed)
 Established Patient Office Visit  Subjective   Patient ID: Norma Wright, female    DOB: November 30, 2000  Age: 22 y.o. MRN: [id number]  Chief Complaint  Patient presents with   Weight Management    Follow up, concerns with low vitamin d and b12 and pain management    HPI  Discussed the use of AI scribe software for clinical note transcription with the patient, who gave verbal consent to proceed.  History of Present Illness   Norma Wright, a patient with a history of chronic pain and weight issues, presents today for a follow-up visit. She reports a significant weight loss of 17 pounds, which she attributes to her current medication, Wegovy. She experiences initial nausea with each increase in dosage but states that this side effect subsides over time. She expresses a desire to increase her current dosage to further her weight loss efforts.  The patient also reports chronic pain. She has seen a rheumatologist who has confirmed a diagnosis of fibromyalgia. She is currently not exercising due to her chronic pain but has recently resumed her membership at the [facility] [employer] where she uses the swimming pool for exercises. She has been taking lyrica which has helped more than the gabapentin. She would like a referral to the [facility].   Norma Wright also reports low levels of Vitamin B12 and D. She has been taking supplements but is considering injections for B12 as she believes she may not be absorbing the vitamins effectively from the pills. She reports ongoing fatigue despite taking the supplements.  The patient also reports significant anxiety, which is affecting her daily life and work. 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 She also reports general anxiety at home and frequent panic attacks. She has previously been on medication for anxiety and is open to restarting medication in addition to therapy.        12/13/2023   10:02 AM 12/13/2023     9:27 AM 08/05/2023    1:50 PM 07/22/2023   10:35 AM 06/13/2023    3:05 PM  Depression screen PHQ 2/9  Decreased Interest 0 0 0 3 1  Down, Depressed, Hopeless 0 0 0 2 1  PHQ - 2 Score 0 0 0 5 2  Altered sleeping 2   3 3   Tired, decreased energy 3   3 3   Change in appetite 0   3 1  Feeling bad or failure about yourself  0   0 0  Trouble concentrating 2   2 3   Moving slowly or fidgety/restless 0   0 0  Suicidal thoughts 0   0 0  PHQ-9 Score 7   16 12   Difficult doing work/chores Very difficult    Somewhat difficult      12/13/2023   10:02 AM 06/13/2023    3:05 PM  GAD 7 : Generalized Anxiety Score  Nervous, Anxious, on Edge 3 2  Control/stop worrying 3 1  Worry too much - different things 3 1  Trouble relaxing 1 0  Restless 0 0  Easily annoyed or irritable 1 0  Afraid - awful might happen 2 0  Total GAD 7 Score 13 4  Anxiety Difficulty Extremely difficult Somewhat difficult       ROS See pertinent positives and negatives per HPI.    Objective:     BP 108/60 (BP Location: Left Arm, Patient Position: Sitting, Cuff Size: Normal)   Pulse 69   Temp 97.6  F (36.4 C)   Ht 5\' 7"  (1.702 m)   Wt 223 lb 6.4 oz (101.3 kg)   LMP 12/01/2023 (Approximate)   SpO2 98%   BMI 34.99 kg/m  BP Readings from Last 3 Encounters:  12/13/23 108/60  12/02/23 113/76  12/02/23 105/81   Wt Readings from Last 3 Encounters:  12/13/23 223 lb 6.4 oz (101.3 kg)  12/02/23 226 lb (102.5 kg)  11/08/23 228 lb 9.6 oz (103.7 kg)      Physical Exam Vitals and nursing note reviewed.  Constitutional:      General: She is not in acute distress.    Appearance: Normal appearance.  HENT:     Head: Normocephalic.  Eyes:     Conjunctiva/sclera: Conjunctivae normal.  Cardiovascular:     Rate and Rhythm: Normal rate and regular rhythm.     Pulses: Normal pulses.     Heart sounds: Normal heart sounds.  Pulmonary:     Effort: Pulmonary effort is normal.     Breath sounds: Normal breath sounds.   Musculoskeletal:     Cervical back: Normal range of motion.  Skin:    General: Skin is warm.  Neurological:     General: No focal deficit present.     Mental Status: She is alert and oriented to person, place, and time.  Psychiatric:        Mood and Affect: Mood normal.        Behavior: Behavior normal.        Thought Content: Thought content normal.        Judgment: Judgment normal.      Assessment & Plan:   Problem List Items Addressed This Visit       Other   Generalized anxiety disorder   Chronic, not controlled. Significant anxiety impacts daily life. Zoloft chosen for its weight-neutrality and compatibility. Therapy and medication options discussed. Prescribe Zoloft 25mg  daily and refer to a therapist for counseling. Discussed possible side effects. Follow-up in 4-6 weeks.      Relevant Medications   sertraline (ZOLOFT) 25 MG tablet   Other Relevant Orders   TSH (Completed)   Ambulatory referral to Psychology   Obesity (BMI 30-39.9)   She lost 17 pounds on Wegovy with manageable nausea. Currently on 1 mg, plans to increase to 1.7 mg. Physical activity aids chronic pain. Increase Wegovy dose to 1.7 mg and continue physical activity at the [facility].      Relevant Medications   Semaglutide-Weight Management (WEGOVY) 1.7 MG/0.75ML SOAJ   Fibromyalgia - Primary   Chronic pain managed with pregabalin. Refer to [facility] for pain management.      Relevant Medications   sertraline (ZOLOFT) 25 MG tablet   Other Relevant Orders   Ambulatory referral to Pain Clinic   Vitamin B12 deficiency   She is currently taking an OTC vitamin B12 supplement daily. Will check B12 levels and adjust regimen based on results.       Relevant Orders   CBC with Differential/Platelet (Completed)   Vitamin B12 (Completed)   Vitamin D deficiency   She is currently taking an OTC vitamin D supplement daily. Will check D levels and adjust regimen based on results. She recently  misplaced her prescription vitamin D and can't get it refilled until May.       Relevant Orders   VITAMIN D 25 Hydroxy (Vit-D Deficiency, Fractures) (Completed)   Iron deficiency   Relevant Orders   CBC with Differential/Platelet (Completed)   Ferritin (  Completed)    Return in about 6 weeks (around 01/24/2024) for 6-8 weeks anxiety, weight.    Gerre Scull, NP

## 2023-12-13 NOTE — Assessment & Plan Note (Signed)
 Chronic, not controlled. Significant anxiety impacts daily life. Zoloft chosen for its weight-neutrality and compatibility. Therapy and medication options discussed. Prescribe Zoloft 25mg  daily and refer to a therapist for counseling. Discussed possible side effects. Follow-up in 4-6 weeks.

## 2023-12-16 ENCOUNTER — Ambulatory Visit (INDEPENDENT_AMBULATORY_CARE_PROVIDER_SITE_OTHER): Payer: Medicaid Other

## 2023-12-16 DIAGNOSIS — R3989 Other symptoms and signs involving the genitourinary system: Secondary | ICD-10-CM | POA: Diagnosis not present

## 2023-12-16 MED ORDER — SODIUM BICARBONATE 8.4 % IV SOLN
5.0000 mL | Freq: Once | INTRAVENOUS | Status: AC
  Administered 2023-12-16: 5 mL

## 2023-12-16 MED ORDER — LIDOCAINE HCL 2 % IJ SOLN
20.0000 mL | Freq: Once | INTRAMUSCULAR | Status: AC
  Administered 2023-12-16: 400 mg

## 2023-12-16 MED ORDER — BUPIVACAINE HCL 0.25 % IJ SOLN
20.0000 mL | Freq: Once | INTRAMUSCULAR | Status: AC
  Administered 2023-12-16: 20 mL

## 2023-12-16 MED ORDER — HEPARIN SODIUM (PORCINE) 10000 UNIT/ML IJ SOLN
10000.0000 [IU] | Freq: Once | INTRAMUSCULAR | Status: AC
  Administered 2023-12-16: 10000 [IU] via INTRAVESICAL

## 2023-12-16 NOTE — Progress Notes (Signed)
 Bladder Instillation: The patient was identified and verbally consented for the procedure.  The urethra was prepped with Hibiclens . A 16 Fr foley catheter was inserted the bladder and drained for __15__ cc. The foley was then attached to a 60 mL syringe with the plunger removed.  The medication was slowly poured into the bladder via the syringe and foley.  The medication consisted of: 20ml of Lidocaine 2%, 20mL of Bupivicaine 0.25%, 10,000 units/mL Heparin, 5mL Sodium Bicarbonate 8.4%.  The foley was removed and the patient was asked to hold the liquids in her bladder for 30-60 minutes if possible.   Precautions were given and patient was instructed to call the office or on-call number for any concerns.  Alexis Frock, CMA

## 2023-12-16 NOTE — Patient Instructions (Signed)
 Thank you for trusting me with your care!  Please keep all scheduled follow ups.

## 2023-12-18 ENCOUNTER — Ambulatory Visit (INDEPENDENT_AMBULATORY_CARE_PROVIDER_SITE_OTHER)

## 2023-12-18 DIAGNOSIS — E538 Deficiency of other specified B group vitamins: Secondary | ICD-10-CM | POA: Diagnosis not present

## 2023-12-18 MED ORDER — CYANOCOBALAMIN 1000 MCG/ML IJ SOLN
1000.0000 ug | Freq: Once | INTRAMUSCULAR | Status: AC
  Administered 2023-12-18: 1000 ug via INTRAMUSCULAR

## 2023-12-18 NOTE — Progress Notes (Signed)
 Pt here for weekly B12 injection (1 of 4)  per Rodman Pickle, NP.  B12 given IM Right Deltoid and pt tolerated injection well.  2nd B12 injection scheduled for 1 week.   Pt was ordered to have b12 injections every week for 4 weeks then monthly after.   Elam Dutch, CMA

## 2023-12-23 ENCOUNTER — Ambulatory Visit (INDEPENDENT_AMBULATORY_CARE_PROVIDER_SITE_OTHER)

## 2023-12-23 VITALS — BP 123/79 | HR 96

## 2023-12-23 DIAGNOSIS — R3989 Other symptoms and signs involving the genitourinary system: Secondary | ICD-10-CM | POA: Diagnosis not present

## 2023-12-23 DIAGNOSIS — R300 Dysuria: Secondary | ICD-10-CM

## 2023-12-23 MED ORDER — HEPARIN SODIUM (PORCINE) 10000 UNIT/ML IJ SOLN
10000.0000 [IU] | Freq: Once | INTRAMUSCULAR | Status: AC
  Administered 2023-12-23: 10000 [IU] via INTRAVESICAL

## 2023-12-23 MED ORDER — BUPIVACAINE HCL 0.25 % IJ SOLN
20.0000 mL | Freq: Once | INTRAMUSCULAR | Status: AC
Start: 2023-12-23 — End: 2023-12-23
  Administered 2023-12-23: 20 mL

## 2023-12-23 MED ORDER — LIDOCAINE HCL 2 % IJ SOLN
20.0000 mL | Freq: Once | INTRAMUSCULAR | Status: AC
Start: 2023-12-23 — End: 2023-12-23
  Administered 2023-12-23: 400 mg

## 2023-12-23 MED ORDER — SODIUM BICARBONATE 8.4 % IV SOLN
5.0000 mL | Freq: Once | INTRAVENOUS | Status: AC
  Administered 2023-12-23: 5 mL

## 2023-12-23 NOTE — Patient Instructions (Signed)
 Keep all scheduled follow ups.   It was a pleasure to see you today!  Thank you for trusting me with your care!

## 2023-12-23 NOTE — Progress Notes (Signed)
 The patient was identified and verbally consented for the procedure.  The urethra was prepped with hibiclens Pt cathed for 5cc A 16 Fr foley catheter was inserted and attached to a 60 mL syringe with the plunger removed.  The medication was slowly poured into the bladder via the syringe and foley.  The medication consisted of:  Lidocaine 2%, 20mL Bupivicaine 0.25%, 10,000 units/mL, 5mL  Heparin 1mL  Sodium Bicarbonate 8.4% 5mL   The foley was removed and the patient was asked to hold the liquids in her bladder for 30-60 minutes if possible.   Precautions were given and patient was instructed to call the office or on-call number for any concerns.

## 2023-12-25 ENCOUNTER — Encounter: Payer: Self-pay | Admitting: Nurse Practitioner

## 2023-12-25 ENCOUNTER — Ambulatory Visit

## 2023-12-25 ENCOUNTER — Ambulatory Visit: Admitting: Nurse Practitioner

## 2023-12-25 VITALS — BP 104/62 | HR 73 | Temp 98.5°F | Ht 67.0 in | Wt 220.2 lb

## 2023-12-25 DIAGNOSIS — K649 Unspecified hemorrhoids: Secondary | ICD-10-CM | POA: Diagnosis not present

## 2023-12-25 DIAGNOSIS — F411 Generalized anxiety disorder: Secondary | ICD-10-CM

## 2023-12-25 DIAGNOSIS — R55 Syncope and collapse: Secondary | ICD-10-CM | POA: Insufficient documentation

## 2023-12-25 DIAGNOSIS — R42 Dizziness and giddiness: Secondary | ICD-10-CM

## 2023-12-25 DIAGNOSIS — E538 Deficiency of other specified B group vitamins: Secondary | ICD-10-CM | POA: Diagnosis not present

## 2023-12-25 MED ORDER — CYANOCOBALAMIN 1000 MCG/ML IJ SOLN
1000.0000 ug | Freq: Once | INTRAMUSCULAR | Status: AC
  Administered 2023-12-25: 1000 ug via INTRAMUSCULAR

## 2023-12-25 MED ORDER — SERTRALINE HCL 50 MG PO TABS: 50.0000 mg | ORAL_TABLET | Freq: Every day | ORAL | 1 refills | Status: DC

## 2023-12-25 NOTE — Assessment & Plan Note (Addendum)
 Chronic, not controlled. She is still experiencing anxiety and is not having any side effects to zoloft. Will increase to 50mg  daily. Keep follow-up appointment in 4 weeks.

## 2023-12-25 NOTE — Assessment & Plan Note (Signed)
 Chronic hemorrhoids present with burning, pruritus, and bleeding, related to past IBS and constipation. Current pruritus is managed with Preparation H. She should continue using over-the-counter Preparation H and make dietary modifications to prevent constipation, including increased fiber intake and hydration. She would like referral for treatment options. Referral placed to general surgery.

## 2023-12-25 NOTE — Assessment & Plan Note (Signed)
 Vitamin B12 injection given today. Will get next injection in 1 week.
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
## 2023-12-25 NOTE — Patient Instructions (Signed)
 It was great to see you!  I have increased your zoloft to 50mg  daily. You can take 2 of your current pills, then go back down to 1 pill when you pick up the new bottle.  I am placing a referral to cardiology for your lightheadedness.   Keep using preparation H as needed and limiting constipation and straining. You can also do a sitz bath as needed. Keep drinking plenty of water and you can take miralax daily as needed for constipation.   Let's follow-up at next scheduled visit, sooner if you have concerns.  If a referral was placed today, you will be contacted for an appointment. Please note that routine referrals can sometimes take up to 3-4 weeks to process. Please call our office if you haven't heard anything after this time frame.  Take care,  Rodman Pickle, NP

## 2023-12-27 ENCOUNTER — Encounter (HOSPITAL_BASED_OUTPATIENT_CLINIC_OR_DEPARTMENT_OTHER): Payer: Self-pay | Admitting: Emergency Medicine

## 2023-12-27 ENCOUNTER — Emergency Department (HOSPITAL_BASED_OUTPATIENT_CLINIC_OR_DEPARTMENT_OTHER)
Admission: EM | Admit: 2023-12-27 | Discharge: 2023-12-27 | Disposition: A | Discharge status: Home / Self Care | Attending: Emergency Medicine | Admitting: Emergency Medicine

## 2023-12-27 ENCOUNTER — Emergency Department (HOSPITAL_BASED_OUTPATIENT_CLINIC_OR_DEPARTMENT_OTHER)

## 2023-12-27 ENCOUNTER — Other Ambulatory Visit: Payer: Self-pay

## 2023-12-27 DIAGNOSIS — R55 Syncope and collapse: Secondary | ICD-10-CM | POA: Diagnosis present

## 2023-12-27 DIAGNOSIS — R519 Headache, unspecified: Secondary | ICD-10-CM | POA: Insufficient documentation

## 2023-12-27 DIAGNOSIS — G8929 Other chronic pain: Secondary | ICD-10-CM

## 2023-12-27 LAB — CBC
HCT: 40.6 % (ref 36.0–46.0)
Hemoglobin: 13.2 g/dL (ref 12.0–15.0)
MCH: 29.9 pg (ref 26.0–34.0)
MCHC: 32.5 g/dL (ref 30.0–36.0)
MCV: 91.9 fL (ref 80.0–100.0)
Platelets: 297 10*3/uL (ref 150–400)
RBC: 4.42 MIL/uL (ref 3.87–5.11)
RDW: 13.1 % (ref 11.5–15.5)
WBC: 5.6 10*3/uL (ref 4.0–10.5)
nRBC: 0 % (ref 0.0–0.2)

## 2023-12-27 LAB — BASIC METABOLIC PANEL WITH GFR
Anion gap: 9 (ref 5–15)
BUN: 8 mg/dL (ref 6–20)
CO2: 26 mmol/L (ref 22–32)
Calcium: 9.3 mg/dL (ref 8.9–10.3)
Chloride: 105 mmol/L (ref 98–111)
Creatinine, Ser: 0.82 mg/dL (ref 0.44–1.00)
GFR, Estimated: >60 mL/min (ref >60)
Glucose, Bld: 74 mg/dL (ref 70–99)
Potassium: 3.5 mmol/L (ref 3.5–5.1)
Sodium: 140 mmol/L (ref 135–145)

## 2023-12-27 LAB — URINALYSIS, ROUTINE W REFLEX MICROSCOPIC
Bilirubin Urine: NEGATIVE
Glucose, UA: NEGATIVE mg/dL
Ketones, ur: NEGATIVE mg/dL
Leukocytes,Ua: NEGATIVE
Nitrite: NEGATIVE
Protein, ur: NEGATIVE mg/dL
Specific Gravity, Urine: >=1.03 (ref 1.005–1.030)
pH: 6 (ref 5.0–8.0)

## 2023-12-27 LAB — URINALYSIS, MICROSCOPIC (REFLEX)

## 2023-12-27 LAB — PREGNANCY, URINE: Preg Test, Ur: NEGATIVE

## 2023-12-27 LAB — CBG MONITORING, ED: Glucose-Capillary: 81 mg/dL (ref 70–99)

## 2023-12-27 MED ORDER — DEXAMETHASONE SODIUM PHOSPHATE 10 MG/ML IJ SOLN
10.0000 mg | Freq: Once | INTRAMUSCULAR | Status: AC
  Administered 2023-12-27: 10 mg via INTRAVENOUS
  Filled 2023-12-27: qty 1

## 2023-12-27 MED ORDER — LACTATED RINGERS IV BOLUS
1000.0000 mL | Freq: Once | INTRAVENOUS | Status: AC
  Administered 2023-12-27: 1000 mL via INTRAVENOUS

## 2023-12-27 MED ORDER — METOCLOPRAMIDE HCL 5 MG/ML IJ SOLN
10.0000 mg | Freq: Once | INTRAMUSCULAR | Status: AC
  Administered 2023-12-27: 10 mg via INTRAVENOUS
  Filled 2023-12-27: qty 2

## 2023-12-27 NOTE — Discharge Instructions (Signed)
 Start increasing the protein in your diet to help build muscle.  Try to get 60-70grams per day.  Stay hydrated and avoid heavy exercise till you see your cardiologist.  Neurology should call you about your headaches for an appointment.
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
## 2023-12-27 NOTE — ED Notes (Signed)
 Patient transported to CT

## 2023-12-31 ENCOUNTER — Telehealth: Payer: Self-pay

## 2023-12-31 NOTE — Transitions of Care (Post Inpatient/ED Visit) (Signed)
   12/31/2023  Name: Norma Wright MRN: [id number] DOB: 2001-05-04  Today's TOC FU Call Status: Today's TOC FU Call Status:: Successful TOC FU Call Completed TOC FU Call Complete Date: 12/31/23 Patient's Name and Date of Birth confirmed.  Transition Care Management Follow-up Telephone Call Date of Discharge: 12/27/23 Discharge Facility: [facility] Type of Discharge: Emergency Department Reason for ED Visit: Other: (near synope and vaginal bleeding) How have you been since you were released from the hospital?: Same Any questions or concerns?: Yes Patient Questions/Concerns:: Pt C/O of frequent head pain Patient Questions/Concerns Addressed: Provided Patient Educational Materials  Items Reviewed: Did you receive and understand the discharge instructions provided?: Yes Medications obtained,verified, and reconciled?: Yes (Medications Reviewed) Any new allergies since your discharge?: No Dietary orders reviewed?: NA Do you have support at home?: No  Medications Reviewed Today: Medications Reviewed Today     Reviewed by Leroy Kennedy, CMA (Certified Medical Assistant) on 12/31/23 at [phone]  Med List Status: <None>   Medication Order Taking? Sig Documenting Provider Last Dose Status Informant  diclofenac Sodium (VOLTAREN ARTHRITIS PAIN) 1 % GEL [id number] No Apply 4 g topically 4 (four) times daily.  Patient not taking: Reported on 12/13/2023   Angelina Sheriff, DO Not Taking Active   lidocaine (XYLOCAINE) 5 % ointment [id number] No Apply 1 Application topically as needed. Use peasize 0.5g over vulva up to three times as needed  Patient not taking: Reported on 12/13/2023   Loleta Chance, MD Not Taking Active   Melatonin 5 MG CHEW [id number] No Chew by mouth. Takes 2 at night  Patient not taking: Reported on 12/13/2023   [provider] Not Taking Active   Multiple Vitamin (MULTI-VITAMIN) tablet [id number] Yes Take 1 tablet by mouth daily. [provider] Taking  Active   Norethindrone-Ethinyl Estradiol-Fe Biphas (LO LOESTRIN FE) 1 MG-10 MCG / 10 MCG tablet [id number] Yes Take 1 tablet by mouth daily. Chrzanowski, Lamona Curl, NP Taking Active   pregabalin (LYRICA) 50 MG capsule [id number] Yes Take 1 capsule (50 mg total) by mouth at bedtime for 7 days, THEN 2 capsules (100 mg total) at bedtime for 23 days. Angelina Sheriff, DO Taking Active   Semaglutide-Weight Management [facility]) 1.7 MG/0.75ML Ivory Broad [id number] Yes Inject 1.7 mg into the skin once a week. McElwee, Lauren A, NP Taking Active   sertraline (ZOLOFT) 50 MG tablet [id number] Yes Take 1 tablet (50 mg total) by mouth daily. Gerre Scull, NP Taking Active   VITAMIN D PO [id number] Yes Take by mouth once a week. [provider] Taking Active             Home Care and Equipment/Supplies: Were Home Health Services Ordered?: NA Any new equipment or medical supplies ordered?: NA  Functional Questionnaire: Do you need assistance with bathing/showering or dressing?: No Do you need assistance with meal preparation?: No Do you need assistance with eating?: No Do you have difficulty maintaining continence: No Do you need assistance with getting out of bed/getting out of a chair/moving?: No Do you have difficulty managing or taking your medications?: No  Follow up appointments reviewed: PCP Follow-up appointment confirmed?: Yes Date of PCP follow-up appointment?: 01/15/24 Follow-up Provider: Rodman Pickle NP [facility] Follow-up appointment confirmed?: NA (Pt was advised to follow up with neurology) Do you need transportation to your follow-up appointment?: No Do you understand care options if your condition(s) worsen?: Yes-patient verbalized understanding    SIGNATURE Jodelle Green, RMA

## 2024-01-01 ENCOUNTER — Telehealth: Payer: Self-pay

## 2024-01-01 ENCOUNTER — Ambulatory Visit (INDEPENDENT_AMBULATORY_CARE_PROVIDER_SITE_OTHER)

## 2024-01-01 DIAGNOSIS — E538 Deficiency of other specified B group vitamins: Secondary | ICD-10-CM | POA: Diagnosis not present

## 2024-01-01 DIAGNOSIS — E611 Iron deficiency: Secondary | ICD-10-CM

## 2024-01-01 MED ORDER — ONDANSETRON HCL 4 MG PO TABS: 4.0000 mg | ORAL_TABLET | Freq: Three times a day (TID) | ORAL | 0 refills | Status: DC | PRN

## 2024-01-01 MED ORDER — CYANOCOBALAMIN 1000 MCG/ML IJ SOLN
1000.0000 ug | Freq: Once | INTRAMUSCULAR | Status: AC
  Administered 2024-01-01: 1000 ug via INTRAMUSCULAR

## 2024-01-01 NOTE — Progress Notes (Signed)
 Patient is in office today for a nurse visit  B12 Injection. Patient Injection was given in the  Left deltoid. Patient tolerated injection well.

## 2024-01-01 NOTE — Telephone Encounter (Signed)
 Spoke to Pt while in clinic ([place]) and she voiced concerns with nausea and ongoing migraines. Pt has an upcoming appointment ED visit follow up on 01/15/2024.   Rx request for Zofran and headache management.

## 2024-01-01 NOTE — Telephone Encounter (Signed)
 Spoke to Pt and relied message below. Pt verbalized understanding and a thank you.

## 2024-01-08 ENCOUNTER — Ambulatory Visit

## 2024-01-08 ENCOUNTER — Encounter: Payer: Self-pay | Admitting: Nurse Practitioner

## 2024-01-08 DIAGNOSIS — F411 Generalized anxiety disorder: Secondary | ICD-10-CM

## 2024-01-09 ENCOUNTER — Encounter: Admitting: Sports Medicine
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
## 2024-01-13 ENCOUNTER — Ambulatory Visit (INDEPENDENT_AMBULATORY_CARE_PROVIDER_SITE_OTHER): Admitting: Sports Medicine

## 2024-01-13 VITALS — BP 122/76 | Ht 67.0 in | Wt 211.0 lb

## 2024-01-13 DIAGNOSIS — N921 Excessive and frequent menstruation with irregular cycle: Secondary | ICD-10-CM | POA: Diagnosis not present

## 2024-01-13 DIAGNOSIS — G8929 Other chronic pain: Secondary | ICD-10-CM

## 2024-01-13 DIAGNOSIS — R519 Headache, unspecified: Secondary | ICD-10-CM

## 2024-01-13 DIAGNOSIS — R634 Abnormal weight loss: Secondary | ICD-10-CM | POA: Diagnosis not present

## 2024-01-13 DIAGNOSIS — M797 Fibromyalgia: Secondary | ICD-10-CM | POA: Diagnosis not present

## 2024-01-13 DIAGNOSIS — R42 Dizziness and giddiness: Secondary | ICD-10-CM

## 2024-01-13 DIAGNOSIS — R55 Syncope and collapse: Secondary | ICD-10-CM

## 2024-01-13 NOTE — Patient Instructions (Addendum)
 Thank you for coming in today.  I do not think that your current symptoms are related to your prior concussion.  It is highly unlikely to have recurrent concussion symptoms without a new injury and based on your improvement through November/December, and then new onset of symptoms in January/February, the timeline, physical exam do not fit with recurrent concussion symptoms.  I do feel that there are several things that could explain your ongoing headaches, dizzy spells, presyncope and I would recommend discussing these things with your other providers.  A possible explanation is rapid weight loss on Wegovy .  Patient has lost 53 pounds in 5 months.  This rapid weight loss can lead to orthostatic vitals, autonomic dysregulation.  Patient continues to have decreased appetite and nausea after eating, which can lead to nutritional deficiencies and deconditioning.  Could discuss discontinuing Wegovy  with prescribing physician.  Patient's symptoms started around January/February, shortly after starting pregabalin  for fibromyalgia.  Pregabalin  could be contributing to ongoing symptoms and patient could discuss decreasing or discontinuing medication with prescribing physician.  Patient continues to have heavy menstrual cycles that have not improved with change in OCP.  These heavy menstrual cycles could lead to anemia and contribute to ongoing symptoms.  Could discuss alternative treatments with OB/GYN such as Nexplanon/IUD/other.  As need follow up

## 2024-01-15 ENCOUNTER — Ambulatory Visit (INDEPENDENT_AMBULATORY_CARE_PROVIDER_SITE_OTHER): Admitting: Nurse Practitioner

## 2024-01-15 ENCOUNTER — Encounter: Payer: Self-pay | Admitting: Nurse Practitioner

## 2024-01-15 ENCOUNTER — Ambulatory Visit

## 2024-01-15 VITALS — BP 104/62 | HR 76 | Temp 97.7°F | Ht 67.0 in | Wt 211.6 lb

## 2024-01-15 DIAGNOSIS — R42 Dizziness and giddiness: Secondary | ICD-10-CM | POA: Diagnosis not present

## 2024-01-15 DIAGNOSIS — E538 Deficiency of other specified B group vitamins: Secondary | ICD-10-CM

## 2024-01-15 DIAGNOSIS — R519 Headache, unspecified: Secondary | ICD-10-CM

## 2024-01-15 DIAGNOSIS — F411 Generalized anxiety disorder: Secondary | ICD-10-CM

## 2024-01-15 DIAGNOSIS — N946 Dysmenorrhea, unspecified: Secondary | ICD-10-CM

## 2024-01-15 MED ORDER — CYANOCOBALAMIN 1000 MCG/ML IJ SOLN
1000.0000 ug | Freq: Once | INTRAMUSCULAR | Status: AC
  Administered 2024-01-15: 1000 ug via INTRAMUSCULAR

## 2024-01-15 MED ORDER — WEGOVY 1 MG/0.5ML ~~LOC~~ SOAJ: 1.0000 mg | SUBCUTANEOUS | 0 refills | Status: DC

## 2024-01-15 NOTE — Assessment & Plan Note (Signed)
 Encouraged her to keep follow-up appointments with GYN and continue iron supplement 65mg  daily.

## 2024-01-15 NOTE — Assessment & Plan Note (Signed)
 Anxiety is contributing to chest pain and sleep issues. She is currently taking zoloft  50mg  daily. Continue Zoloft  and follow up with a psychiatrist for further evaluation and management.

## 2024-01-15 NOTE — Assessment & Plan Note (Signed)
 Headaches are possibly related to inadequate nutrition and dehydration, with photophobia and nausea. Increase caloric and fluid intake. Consider Tylenol for headache relief if needed. Will start preventative medication next month if still having ongoing headaches.

## 2024-01-15 NOTE — Progress Notes (Signed)
 Established Patient Office Visit  Subjective   Patient ID: Norma Wright, female    DOB: 2000/11/13  Age: 23 y.o. MRN: [id number]  Chief Complaint  Patient presents with   Hospitalization Follow-up    Vaginal bleeding, near syncope-ED visit on 12/27/23    HPI Discussed the use of AI scribe software for clinical note transcription with the patient, who gave verbal consent to proceed.  History of Present Illness   Norma Wright is a 23 year old female who presents with episodes of lightheadedness and nausea.  She experienced lightheadedness, headaches, and nausea, prompting an emergency room visit where her labs were normal. She is taking an iron supplement, but is still having ongoing irregular menstrual bleeding with 3 menses this last month. She has also been losing weight quickly with Wegovy  which may contribute to her symptoms. Her diet is limited, consisting of half an Uncrustable and half an orange for lunch, skipping breakfast, and a [employer] pizza for dinner. She drinks three water bottles daily but still feels thirsty.  Headaches began in January, worsening last month, with photophobia and nausea. She avoids Tylenol due to medication concerns, uses glasses for light sensitivity, and rests in a dark room. Chest pain occurs at night, linked to anxiety. She takes Zoloft  50mg  daily, with a psychiatrist appointment scheduled tomorrow. She is not exercising due to blackouts during activity and is asking how many calories she should be eating a day.        ROS See pertinent positives and negatives per HPI.    Objective:     BP 104/62 (BP Location: Left Arm, Patient Position: Sitting, Cuff Size: Normal)   Pulse 76   Temp 97.7 F (36.5 C)   Ht 5\' 7"  (1.702 m)   Wt 211 lb 9.6 oz (96 kg)   LMP 01/10/2024 (Exact Date)   SpO2 98%   BMI 33.14 kg/m  BP Readings from Last 3 Encounters:  01/15/24 104/62  01/13/24 122/76  12/27/23 116/76   Wt Readings from Last 3 Encounters:   01/15/24 211 lb 9.6 oz (96 kg)  01/13/24 211 lb (95.7 kg)  12/27/23 218 lb 4.1 oz (99 kg)     Physical Exam Vitals and nursing note reviewed.  Constitutional:      General: She is not in acute distress.    Appearance: Normal appearance.  HENT:     Head: Normocephalic.  Eyes:     Conjunctiva/sclera: Conjunctivae normal.  Cardiovascular:     Rate and Rhythm: Normal rate and regular rhythm.     Pulses: Normal pulses.     Heart sounds: Normal heart sounds.  Pulmonary:     Effort: Pulmonary effort is normal.     Breath sounds: Normal breath sounds.  Abdominal:     Palpations: Abdomen is soft.     Tenderness: There is no abdominal tenderness.  Musculoskeletal:     Cervical back: Normal range of motion.  Skin:    General: Skin is warm.  Neurological:     General: No focal deficit present.     Mental Status: She is alert and oriented to person, place, and time.  Psychiatric:        Mood and Affect: Mood normal.        Behavior: Behavior normal.        Thought Content: Thought content normal.        Judgment: Judgment normal.      Assessment & Plan:   Problem List Items Addressed This  Visit       Genitourinary   Dysmenorrhea - Primary   Encouraged her to keep follow-up appointments with GYN and continue iron supplement 65mg  daily.         Other   Generalized anxiety disorder   Anxiety is contributing to chest pain and sleep issues. She is currently taking zoloft  50mg  daily. Continue Zoloft  and follow up with a psychiatrist for further evaluation and management.      Bilateral headaches   Headaches are possibly related to inadequate nutrition and dehydration, with photophobia and nausea. Increase caloric and fluid intake. Consider Tylenol for headache relief if needed. Will start preventative medication next month if still having ongoing headaches.       Vitamin B12 deficiency   B12 injection given today. Will continue injections monthly for 6 months.        Relevant Medications   cyanocobalamin  (VITAMIN B12) injection 1,000 mcg   Light headed   Lightheadedness is likely due to low caloric intake and possible dehydration, with normal blood counts. Increase caloric intake to at least 1200 calories per day, ideally 1500-1600 calories, and encourage hydration with water and one electrolyte drink per day. Will decrease wegovy  to 1 mg weekly and may need to decrease or stop further based on symptoms.        Return in about 4 weeks (around 02/12/2024) for presyncope, headaches.    Odette Benjamin, NP

## 2024-01-15 NOTE — Assessment & Plan Note (Signed)
 Lightheadedness is likely due to low caloric intake and possible dehydration, with normal blood counts. Increase caloric intake to at least 1200 calories per day, ideally 1500-1600 calories, and encourage hydration with water and one electrolyte drink per day. Will decrease wegovy  to 1 mg weekly and may need to decrease or stop further based on symptoms.

## 2024-01-15 NOTE — Assessment & Plan Note (Signed)
 B12 injection given today. Will continue injections monthly for 6 months.

## 2024-01-15 NOTE — Patient Instructions (Signed)
 It was great to see you!  Decrease your wegovy  to 1 mg weekly   Try to eat smaller meals more frequently  Drink plenty of water and add in a gatorade or powerade once a day   Keep taking your iron supplement   Let's follow-up in 4 weeks, sooner if you have concerns.  If a referral was placed today, you will be contacted for an appointment. Please note that routine referrals can sometimes take up to 3-4 weeks to process. Please call our office if you haven't heard anything after this time frame.  Take care,  Rheba Cedar, NP

## 2024-01-16 ENCOUNTER — Other Ambulatory Visit: Payer: Self-pay | Admitting: Nurse Practitioner

## 2024-01-16 NOTE — Telephone Encounter (Signed)
 Requesting: SERTRALINE  HCL 50 MG TABLET  Last Visit: 01/15/2024 Next Visit: 01/20/2024 Last Refill: 01/15/2024  Please Advise   Pharmacy comment: REQUEST FOR 90 DAYS PRESCRIPTION.

## 2024-01-17 ENCOUNTER — Ambulatory Visit: Payer: Medicaid Other | Admitting: Obstetrics and Gynecology

## 2024-01-20 ENCOUNTER — Ambulatory Visit: Admitting: Nurse Practitioner

## 2024-01-20 DIAGNOSIS — K640 First degree hemorrhoids: Secondary | ICD-10-CM | POA: Insufficient documentation
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
## 2024-01-31 ENCOUNTER — Ambulatory Visit: Admitting: General Practice

## 2024-02-02 ENCOUNTER — Ambulatory Visit (HOSPITAL_COMMUNITY)
Admission: EM | Admit: 2024-02-02 | Discharge: 2024-02-02 | Disposition: A | Discharge status: Home / Self Care | Attending: Family Medicine | Admitting: Family Medicine

## 2024-02-02 DIAGNOSIS — F329 Major depressive disorder, single episode, unspecified: Secondary | ICD-10-CM | POA: Insufficient documentation

## 2024-02-02 DIAGNOSIS — F411 Generalized anxiety disorder: Secondary | ICD-10-CM | POA: Insufficient documentation

## 2024-02-02 DIAGNOSIS — F429 Obsessive-compulsive disorder, unspecified: Secondary | ICD-10-CM | POA: Insufficient documentation

## 2024-02-02 DIAGNOSIS — Z9152 Personal history of nonsuicidal self-harm: Secondary | ICD-10-CM | POA: Insufficient documentation

## 2024-02-02 DIAGNOSIS — F4323 Adjustment disorder with mixed anxiety and depressed mood: Secondary | ICD-10-CM | POA: Insufficient documentation
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
## 2024-02-02 NOTE — ED Notes (Signed)
 Patient discharged in no acute distress. Belongings returned from the orange locker. AVS given and reviewed. Understanding voiced.

## 2024-02-02 NOTE — Discharge Instructions (Addendum)
 Discharge Recommendations:   Medications: Patient is to take medications as prescribed. The patient or patient's guardian is to contact a medical professional and/or outpatient provider to address any new side effects that develop. The patient or the patient's guardian should update outpatient providers of any new medications and/or medication changes.   Continue fluoxetine as previously prescribed by your [facility] provider.   Outpatient Follow up: Please review list of outpatient resources for psychiatry and counseling. Please follow up with your primary care provider for all medical related needs.   Please review the attached resources for additional options for counseling   Therapy: We recommend that patient participate in individual therapy to address mental health concerns.  Continue outpatient treatment with [facility] and begin counseling services as offered by them.   Safety:   The following safety precautions should be taken:   No sharp objects. This includes scissors, razors, scrapers, and putty knives.   Chemicals should be removed and locked up.   Medications should be removed and locked up.   Weapons should be removed and locked up. This includes firearms, knives and instruments that can be used to cause injury.   The patient should abstain from use of illicit substances/drugs and abuse of any medications.  If symptoms worsen or do not continue to improve or if the patient becomes actively suicidal or homicidal then it is recommended that the patient return to the closest hospital emergency department, the [facility], or call 911 for further evaluation and treatment.  National Suicide Prevention Lifeline 1-800-SUICIDE or 7747547119.  About 988 988 offers 24/7 access to trained crisis counselors who can help people experiencing mental health-related distress. People can call or text 988 or chat 988lifeline.org for themselves  or if they are worried about a loved one who may need crisis support.

## 2024-02-05 ENCOUNTER — Ambulatory Visit (INDEPENDENT_AMBULATORY_CARE_PROVIDER_SITE_OTHER): Admitting: Obstetrics and Gynecology

## 2024-02-05 ENCOUNTER — Encounter: Payer: Self-pay | Admitting: Obstetrics and Gynecology

## 2024-02-05 VITALS — BP 103/70 | HR 77

## 2024-02-05 DIAGNOSIS — N94819 Vulvodynia, unspecified: Secondary | ICD-10-CM | POA: Diagnosis not present

## 2024-02-05 DIAGNOSIS — R3989 Other symptoms and signs involving the genitourinary system: Secondary | ICD-10-CM | POA: Diagnosis not present

## 2024-02-05 DIAGNOSIS — N926 Irregular menstruation, unspecified: Secondary | ICD-10-CM

## 2024-02-05 MED ORDER — LIDOCAINE-PRILOCAINE 2.5-2.5 % EX CREA: 1.0000 | TOPICAL_CREAM | CUTANEOUS | 4 refills | Status: AC | PRN
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
## 2024-02-06 ENCOUNTER — Ambulatory Visit

## 2024-02-17 ENCOUNTER — Encounter: Payer: Self-pay | Admitting: Nurse Practitioner

## 2024-02-17 ENCOUNTER — Ambulatory Visit (INDEPENDENT_AMBULATORY_CARE_PROVIDER_SITE_OTHER): Admitting: Nurse Practitioner

## 2024-02-17 VITALS — BP 98/64 | HR 84 | Temp 97.1°F | Ht 67.0 in | Wt 203.0 lb

## 2024-02-17 DIAGNOSIS — E559 Vitamin D deficiency, unspecified: Secondary | ICD-10-CM

## 2024-02-17 DIAGNOSIS — F411 Generalized anxiety disorder: Secondary | ICD-10-CM

## 2024-02-17 DIAGNOSIS — R42 Dizziness and giddiness: Secondary | ICD-10-CM | POA: Diagnosis not present

## 2024-02-17 DIAGNOSIS — R079 Chest pain, unspecified: Secondary | ICD-10-CM

## 2024-02-17 DIAGNOSIS — E538 Deficiency of other specified B group vitamins: Secondary | ICD-10-CM

## 2024-02-17 LAB — COMPREHENSIVE METABOLIC PANEL WITH GFR
ALT: 12 U/L (ref 0–35)
AST: 15 U/L (ref 0–37)
Albumin: 4.3 g/dL (ref 3.5–5.2)
Alkaline Phosphatase: 38 U/L — ABNORMAL LOW (ref 39–117)
BUN: 7 mg/dL (ref 6–23)
CO2: 25 meq/L (ref 19–32)
Calcium: 9.1 mg/dL (ref 8.4–10.5)
Chloride: 105 meq/L (ref 96–112)
Creatinine, Ser: 0.78 mg/dL (ref 0.40–1.20)
GFR: 109.11 mL/min (ref >60.00)
Glucose, Bld: 80 mg/dL (ref 70–99)
Potassium: 4 meq/L (ref 3.5–5.1)
Sodium: 138 meq/L (ref 135–145)
Total Bilirubin: 0.6 mg/dL (ref 0.2–1.2)
Total Protein: 7 g/dL (ref 6.0–8.3)

## 2024-02-17 LAB — CBC WITH DIFFERENTIAL/PLATELET
Basophils Absolute: 0 10*3/uL (ref 0.0–0.1)
Basophils Relative: 0.7 % (ref 0.0–3.0)
Eosinophils Absolute: 0.1 10*3/uL (ref 0.0–0.7)
Eosinophils Relative: 2.4 % (ref 0.0–5.0)
HCT: 39.3 % (ref 36.0–46.0)
Hemoglobin: 13 g/dL (ref 12.0–15.0)
Lymphocytes Relative: 30.5 % (ref 12.0–46.0)
Lymphs Abs: 1.3 10*3/uL (ref 0.7–4.0)
MCHC: 33 g/dL (ref 30.0–36.0)
MCV: 91.7 fl (ref 78.0–100.0)
Monocytes Absolute: 0.3 10*3/uL (ref 0.1–1.0)
Monocytes Relative: 7.6 % (ref 3.0–12.0)
Neutro Abs: 2.5 10*3/uL (ref 1.4–7.7)
Neutrophils Relative %: 58.8 % (ref 43.0–77.0)
Platelets: 237 10*3/uL (ref 150.0–400.0)
RBC: 4.29 Mil/uL (ref 3.87–5.11)
RDW: 13.5 % (ref 11.5–14.6)
WBC: 4.3 10*3/uL — ABNORMAL LOW (ref 4.5–10.5)

## 2024-02-17 LAB — VITAMIN D 25 HYDROXY (VIT D DEFICIENCY, FRACTURES): VITD: 33.71 ng/mL (ref 30.00–100.00)

## 2024-02-17 LAB — VITAMIN B12: Vitamin B-12: >1500 pg/mL — ABNORMAL HIGH (ref 211–911)

## 2024-02-17 LAB — TSH: TSH: 1.26 u[IU]/mL (ref 0.35–5.50)

## 2024-02-17 MED ORDER — WEGOVY 0.5 MG/0.5ML ~~LOC~~ SOAJ: 0.5000 mg | SUBCUTANEOUS | 1 refills | Status: DC

## 2024-02-17 MED ORDER — CYANOCOBALAMIN 1000 MCG/ML IJ SOLN
1000.0000 ug | Freq: Once | INTRAMUSCULAR | Status: AC
  Administered 2024-02-17: 1000 ug via INTRAMUSCULAR

## 2024-02-17 NOTE — Assessment & Plan Note (Signed)
 She experiences persistent lightheadedness and near syncope upon standing, unresponsive to reduced Wegovy  dosage and increasing calories. She has lost 8 pounds in the last month. Orthostatic vital signs negative. Will decrease wegovy  to 0.5mg  injection weekly. Encourage fluids. Keep appointment with cardiology on June 30. Check CMP, CBC, TSH today.

## 2024-02-17 NOTE — Assessment & Plan Note (Signed)
 Her anxiety has increased due to recent stressors. Fluoxetine is ineffective. She plans to discuss medication efficacy with her psychiatrist next week and is seeking therapy for additional support. Encourage follow-up with the psychiatrist next week and discuss potential for therapy.

## 2024-02-17 NOTE — Assessment & Plan Note (Signed)
 She is currently getting B12 injections, Continue these monthly. Check B12 levels today

## 2024-02-17 NOTE — Assessment & Plan Note (Signed)
Check vitamin D and treat based on results.  ?

## 2024-02-17 NOTE — Patient Instructions (Signed)
 It was great to see you!  Keep your appointment with cardiology on June 30  We are checking your labs today and will let you know the results via mychart/phone.   Decrease your wegovy  to 0.5mg  weekly   Let's follow-up in 8 weeks, sooner if you have concerns.  If a referral was placed today, you will be contacted for an appointment. Please note that routine referrals can sometimes take up to 3-4 weeks to process. Please call our office if you haven't heard anything after this time frame.  Take care,  Rheba Cedar, NP

## 2024-02-17 NOTE — Assessment & Plan Note (Signed)
 Intermittent chest pain occurs with physical activity and persists after exertion. Familial heart problems are noted. Previous cardiology evaluation was normal, but further investigation is warranted. Ensure she keeps the cardiology appointment on June 30th. Last EKG 1 month ago was normal. With recent travel, check d-dimer.
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
## 2024-02-18 ENCOUNTER — Other Ambulatory Visit (HOSPITAL_COMMUNITY): Payer: Self-pay

## 2024-02-18 ENCOUNTER — Telehealth: Payer: Self-pay

## 2024-02-18 ENCOUNTER — Ambulatory Visit: Payer: Self-pay | Admitting: Nurse Practitioner

## 2024-02-18 LAB — D-DIMER, QUANTITATIVE: D-Dimer, Quant: 0.36 ug{FEU}/mL (ref <0.50)

## 2024-02-18 NOTE — Telephone Encounter (Signed)
 Pharmacy Patient Advocate Encounter   Received notification from CoverMyMeds that prior authorization for Wegovy  0.5 is required/requested.   Insurance verification completed.   The patient is insured through [employer] [facility] .   Per test claim: PA required; PA submitted to above mentioned insurance via CoverMyMeds Key/confirmation #/EOC [id number] Status is pending

## 2024-02-18 NOTE — Telephone Encounter (Signed)
 Pharmacy Patient Advocate Encounter  Received notification from [facility] that Prior Authorization for Wegovy  0.5 has been APPROVED from 02/18/24 to 02/17/25. Unable to obtain price due to refill too soon rejection, last fill date 02/18/24 next available fill date 03/10/24   PA #/Case ID/Reference #: [id number]

## 2024-02-19 ENCOUNTER — Encounter: Payer: Self-pay | Admitting: Nurse Practitioner

## 2024-02-19 ENCOUNTER — Other Ambulatory Visit (HOSPITAL_BASED_OUTPATIENT_CLINIC_OR_DEPARTMENT_OTHER): Payer: Self-pay

## 2024-02-21 NOTE — Telephone Encounter (Signed)
 Left message for patient to return call.

## 2024-03-02 ENCOUNTER — Encounter: Admitting: Physical Medicine and Rehabilitation

## 2024-03-04 ENCOUNTER — Ambulatory Visit (INDEPENDENT_AMBULATORY_CARE_PROVIDER_SITE_OTHER)

## 2024-03-04 DIAGNOSIS — Z23 Encounter for immunization: Secondary | ICD-10-CM

## 2024-03-04 NOTE — Progress Notes (Signed)
 Patient presents for Tdap and Meningoccal Vaccine injection. Tdap injection was placed in right and Meningoccal vaccine was placed in left deltoid region by [place] Churches, CMA. Patient tolerated procedure well with no concerns.

## 2024-03-12 ENCOUNTER — Encounter: Payer: Self-pay | Admitting: Nurse Practitioner

## 2024-03-12 MED ORDER — WEGOVY 0.5 MG/0.5ML ~~LOC~~ SOAJ: 0.5000 mg | SUBCUTANEOUS | 1 refills | Status: DC

## 2024-03-12 NOTE — Telephone Encounter (Signed)
 Requesting: Wegovy  Last Visit: 02/17/2024 Next Visit: 04/20/2024 Last Refill: 02/17/2024
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
## 2024-03-15 IMAGING — DX DG CHEST 2V
2 series · 2 of 2 positions shown · non-contrast
Comparison: None.

CLINICAL DATA: Chest pain, short of breath

EXAM:
CHEST - 2 VIEW

[chest pa]
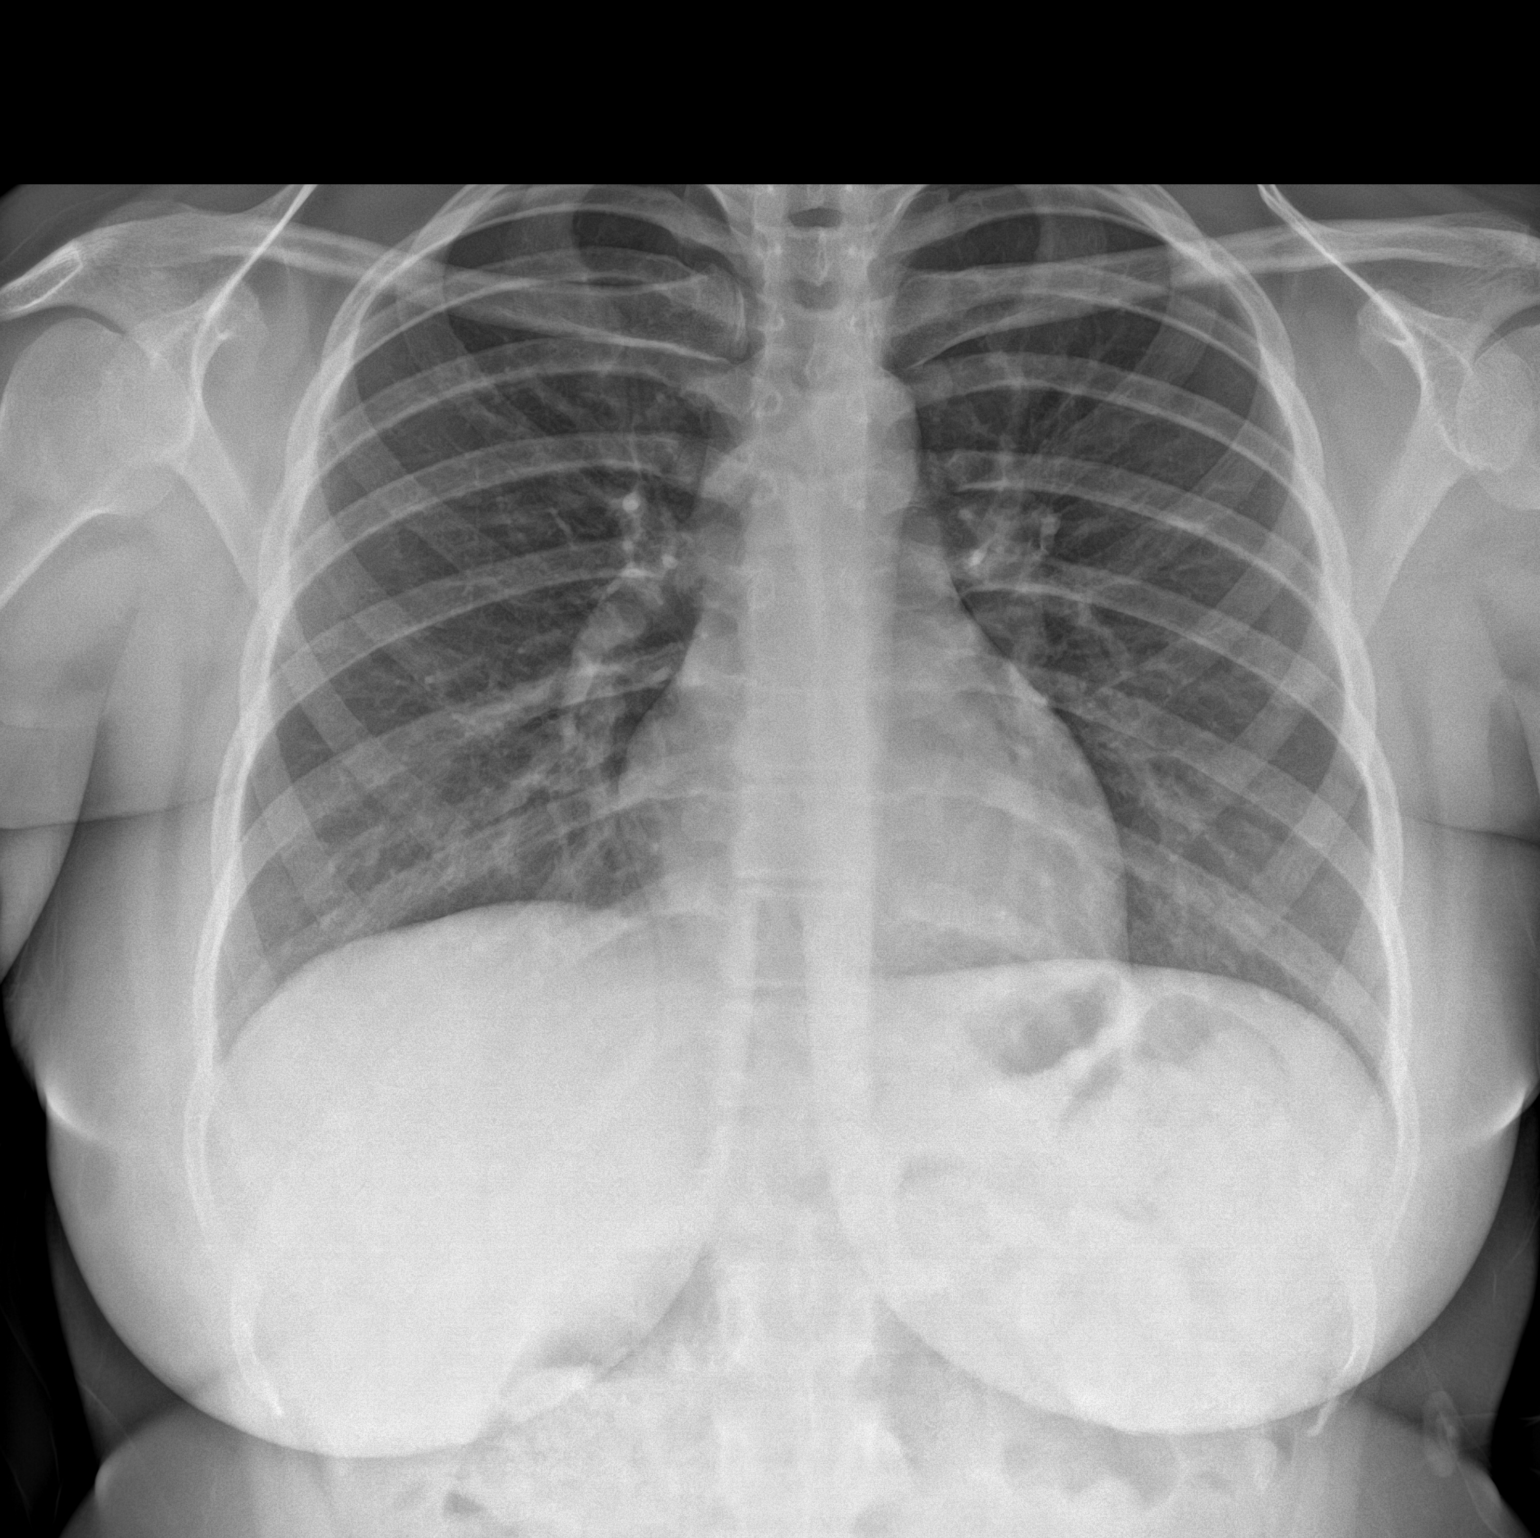

[chest lat]
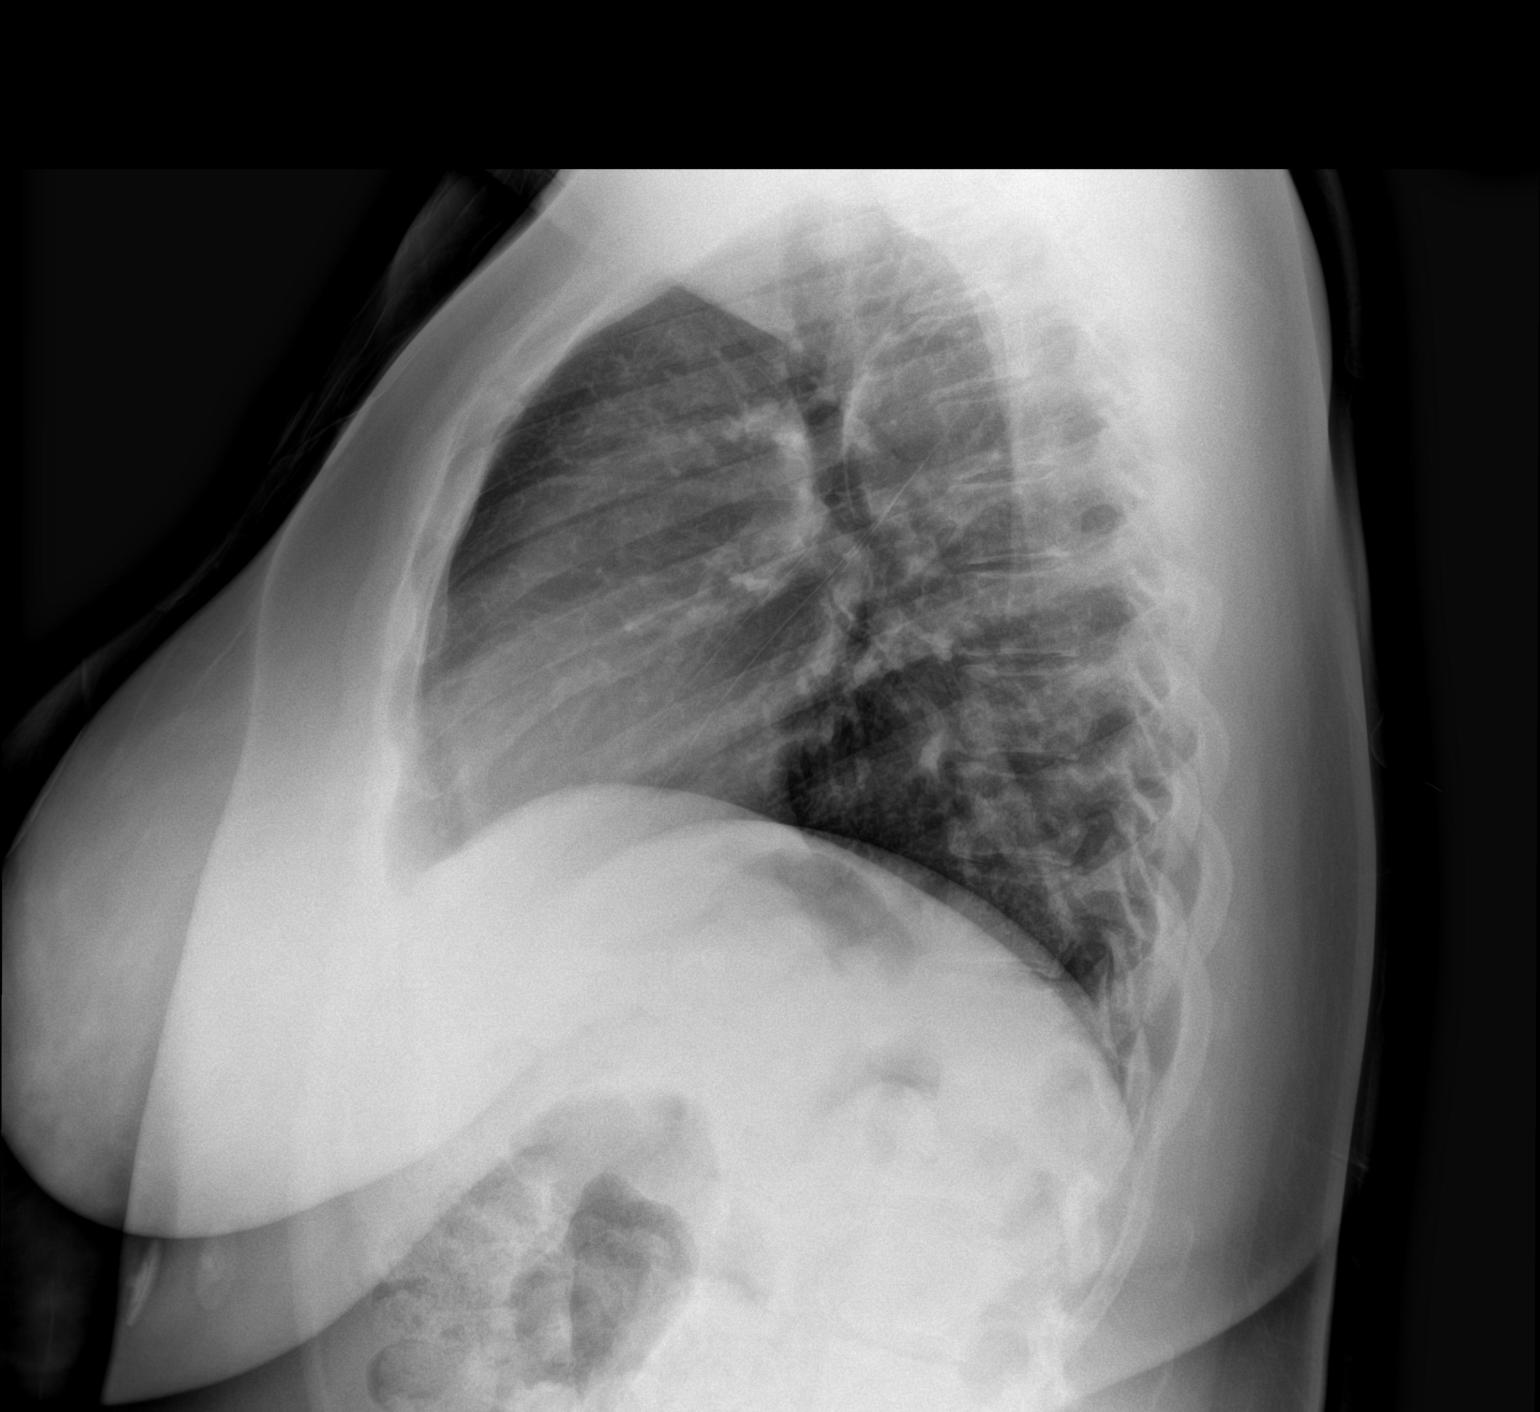

[2 of 2 positions shown; findings below may reference images not displayed]

FINDINGS: Normal mediastinum and cardiac silhouette. Normal pulmonary
vasculature. No evidence of effusion, infiltrate, or pneumothorax.
No acute bony abnormality.
IMPRESSION: No acute cardiopulmonary process.

## 2024-03-16 ENCOUNTER — Encounter: Payer: Self-pay | Admitting: Nurse Practitioner

## 2024-03-16 ENCOUNTER — Ambulatory Visit: Attending: General Practice | Admitting: General Practice

## 2024-03-16 ENCOUNTER — Encounter: Payer: Self-pay | Admitting: General Practice

## 2024-03-16 VITALS — BP 109/72 | HR 80 | Ht 67.0 in | Wt 196.0 lb

## 2024-03-16 DIAGNOSIS — R002 Palpitations: Secondary | ICD-10-CM | POA: Insufficient documentation

## 2024-03-16 DIAGNOSIS — R55 Syncope and collapse: Secondary | ICD-10-CM | POA: Insufficient documentation

## 2024-03-16 NOTE — Patient Instructions (Addendum)
 Medication Instructions:  No medication changes were made at this visit. Continue current regimen.   *If you need a refill on your cardiac medications before your next appointment, please call your pharmacy*  Lab Work: None ordered today. If you have labs (blood work) drawn today and your tests are completely normal, you will receive your results only by: MyChart Message (if you have MyChart) OR A paper copy in the mail If you have any lab test that is abnormal or we need to change your treatment, we will call you to review the results.  Testing/Procedures: Your physician has requested that you wear a Zio heart monitor for 7 days. This will be mailed to your home with instructions on how to apply the monitor and how to return it when finished. Please allow 2 weeks after returning the heart monitor before our office calls you with the results.   Follow-Up: At [facility], you and your health needs are our priority.  As part of our continuing mission to provide you with exceptional heart care, our providers are all part of one team.  This team includes your primary Cardiologist (physician) and Advanced Practice Providers or APPs (Physician Assistants and Nurse Practitioners) who all work together to provide you with the care you need, when you need it.  Your next appointment:   6-8 week(s)  Provider:   Josefa Beauvais, NP  We recommend signing up for the patient portal called MyChart.  Sign up information is provided on this After Visit Summary.  MyChart is used to connect with patients for Virtual Visits (Telemedicine).  Patients are able to view lab/test results, encounter notes, upcoming appointments, etc.  Non-urgent messages can be sent to your provider as well.   To learn more about what you can do with MyChart, go to [email or web address].   Other Instructions Increase oral hydration- can also supplement in electrolytes  Increase protein intake   ------------------------------------------------------- ZIO XT- Long Term Monitor Instructions  Your physician has requested you wear a ZIO patch monitor for 7 days.   This is a single patch monitor. [employer] supplies one patch monitor per enrollment. Additional  stickers are not available. Please do not apply patch if you will be having a Nuclear Stress Test,  Echocardiogram, Cardiac CT, MRI, or Chest Xray during the period you would be wearing the  monitor. The patch cannot be worn during these tests. You cannot remove and re-apply the  ZIO XT patch monitor.   Your ZIO patch monitor will be mailed 3 day USPS to your address on file. It may take 3-5 days  to receive your monitor after you have been enrolled.   Once you have received your monitor, please review the enclosed instructions. Your monitor  has already been registered assigning a specific monitor serial # to you.     Billing and Patient Assistance Program Information  We have supplied [employer] with any of your insurance information on file for billing purposes.  [employer] offers a sliding scale Patient Assistance Program for patients that do not have  insurance, or whose insurance does not completely cover the cost of the ZIO monitor.  You must apply for the Patient Assistance Program to qualify for this discounted rate.   To apply, please call [employer] at [phone], select option 4, select option 2, ask to apply for  Patient Assistance Program. Meredeth will ask your household income, and how many people  are in your household. They will quote your out-of-pocket cost based on  that information.  [employer] will also be able to set up a 30-month, interest-free payment plan if needed.     Applying the monitor  Shave hair from upper left chest.  Hold abrader disc by orange tab. Rub abrader in 40 strokes over the upper left chest as  indicated in your monitor instructions.  Clean area with 4 enclosed alcohol pads. Let dry.  Apply  patch as indicated in monitor instructions. Patch will be placed under collarbone on left  side of chest with arrow pointing upward.  Rub patch adhesive wings for 2 minutes. Remove white label marked 1. Remove the white  label marked 2. Rub patch adhesive wings for 2 additional minutes.  While looking in a mirror, press and release button in center of patch. A small green light will  flash 3-4 times. This will be your only indicator that the monitor has been turned on.  Do not shower for the first 24 hours. You may shower after the first 24 hours.  Press the button if you feel a symptom. You will hear a small click. Record Date, Time and  Symptom in the Patient Logbook.  When you are ready to remove the patch, follow instructions on the last 2 pages of Patient  Logbook. Stick patch monitor onto the last page of Patient Logbook.   Place Patient Logbook in the blue and white box. Use locking tab on box and tape box closed  securely. The blue and white box has prepaid postage on it. Please place it in the mailbox as  soon as possible. Your physician should have your test results approximately 7 days after the  monitor has been mailed back to [facility].   Call [facility] Customer Care at [phone] if you have questions regarding  your ZIO XT patch monitor. Call them immediately if you see an orange light blinking on your  monitor.   If your monitor falls off in less than 4 days, contact our Monitor department at [phone].   If your monitor becomes loose or falls off after 4 days call [employer] at [phone] for  suggestions on securing your monitor.       [employer]  Youth worker for [employer] Mailing Address:  [address];   [address]  [place], [place] [address]  Tel [phone] Fx [phone]     High Quality Legwear for Today's Active Lifestyles Maximum Compression at the ankle. Compression lessens  gradually up the leg.   We manufacture a wide range of compression hosiery for men and women in  different styles, constructions and levels of support.  How Compression Hosiery Works Regulatory affairs officer, [employer]. compression hosiery works by applying graduated pressure to the  muscles and veins in the legs.  When the calf muscle contracts such as during walking  the compression hosiery will "give" and then return to its original position. By doing so  the hosiery is assists your body's circulatory wellness.  The result is increased leg health and vitality.   Maximum Compression at the ankle Compression lessens gradually up the leg  We Offer: Sheer & Opaque Stockings       COLORS:  Nude, black, white and misc. prints Below Knee Thigh High Pantyhose  High Quality Legwear for Today's Active Lifestyles We manufacture a wide range of compression hosiery for men and women in different styles, construction sand levels of support.  Socks:  Sheer & Opaque   Compression Levels Include:                                  Stockings Men's               Below Knee                8-15 mmHg   Women's         Thigh High                 15-20 mmHg  Unisex             Pantyhose                 20-30 mmHg                                                                      30-40 mmHg  4 Simple Ways to Order   Email  [email or web address] Mail/Email orders are subject to processing and handling charges. Allow 7-10 days for receipt.  Phone [phone]  Please allow 24 hours for return call.   In Person  We recommend calling prior to your visit to confirm store hours as they may change due to holiday, weather, and maintenance.   By Mail When placing an order, please have the following information available. Our representatives are available to assist.     Measurements    THIGH      in.   CALF        in.   ANKLE     in.    Compression  8-15 mmHg 15-20 mmHg**   20-30 mmHg 30-40 mmHg    WOMEN'S MEN'S  Shoe Size Sock Size Shoe Size Sock Size  4 - 5 Small 7.5 and Under Small  5.5 - 7.5 Medium 8 - 10 Medium  8 - 10 Large 10.5 - 12 Large  10.5 and Over X-Large 12.5 and Over X-Large   Knee High Size Chart  Length from CALF MEASUREMENT  floor to bend   in knee. 11 12 13 14 15 16 17 18 19 20  21" 22"  14 S S S S M M L L L XL XL XL  15 S S S M M L L L XL XL XXL XXL  16 S S M M M L L L XL XXL XXL XXL  17 S M M M M L L XL XL XXL XXL XXL  18 M M M M L L L XL XL XXL XXL XXL  19 M M M M L L XL XL XL XXL XXL XXL   Thigh High Circumference Sizing Chart                 S M L XL XXL  ANKLE 6.5 - 8 8 - 9.5 9.5 - 11 11 - 12.5 12.5 - 14  CALF 10.5 - 14.5 11.5 - 15.5 12.5 - 17 13.5 - 17.5 14.5 - 19.5  THIGH 15.5 - 22 17.5 - 24 19.5 - 26 22 - 28 26 - 32  HIP UP TO 40 UP TO 44 UP TO 48' UP  TO 52 UP TO 56   Pantyhose Size Chart  Height Petite Medium Tall X-Tall [facility] +   Weight Weight Weight Weight Weight Weight  4'11 95-130 135      5'0 95-125 130-145   170-185   5'1 90-120 125-155 160-165  170-195   5'2 90-115 120-145 150-165  170-195   5'3 90-110 115-140 145-165  170-200 200-225  5'4 100-105 110-135 140-160 165 170-200 200-225  5'5 100 105-130 135-160 165 170-200 200-225  5'6  110-125 130-155 160-165 170-200 200-225  5'7  110-120 125-150 155-165 170-200 195-225  5'8   120-145 150-165 170-200 190-225  5'9   125-140 145-170 175-190 185-220  5'10   125-135 140-185  185-215  5'11   130-135 140-185  190-210

## 2024-03-17 ENCOUNTER — Ambulatory Visit: Attending: General Practice

## 2024-03-17 DIAGNOSIS — R55 Syncope and collapse: Secondary | ICD-10-CM

## 2024-03-17 DIAGNOSIS — R002 Palpitations: Secondary | ICD-10-CM

## 2024-03-17 NOTE — Progress Notes (Unsigned)
 Enrolled patient for a 7 day Zio XT monitor to be mailed to patients home   [name] to read

## 2024-03-19 ENCOUNTER — Ambulatory Visit

## 2024-03-19 DIAGNOSIS — E538 Deficiency of other specified B group vitamins: Secondary | ICD-10-CM | POA: Diagnosis not present

## 2024-03-19 MED ORDER — CYANOCOBALAMIN 1000 MCG/ML IJ SOLN
1000.0000 ug | Freq: Once | INTRAMUSCULAR | Status: AC
  Administered 2024-03-19: 1000 ug via INTRAMUSCULAR

## 2024-03-19 NOTE — Progress Notes (Signed)
 Pt here for monthly B12 injection per Lauren McElwee, NP  B12 1000mcg given IM (L) and pt tolerated injection well.  Next B12 injection scheduled for 1 month

## 2024-03-23 ENCOUNTER — Encounter: Attending: Physical Medicine and Rehabilitation | Admitting: Physical Medicine and Rehabilitation

## 2024-03-23 ENCOUNTER — Encounter: Payer: Self-pay | Admitting: Physical Medicine and Rehabilitation

## 2024-03-23 VITALS — BP 109/71 | HR 77 | Ht 67.0 in | Wt 197.4 lb

## 2024-03-23 DIAGNOSIS — M797 Fibromyalgia: Secondary | ICD-10-CM | POA: Insufficient documentation

## 2024-03-23 DIAGNOSIS — R519 Headache, unspecified: Secondary | ICD-10-CM | POA: Diagnosis not present

## 2024-03-23 DIAGNOSIS — G894 Chronic pain syndrome: Secondary | ICD-10-CM | POA: Diagnosis present

## 2024-03-23 DIAGNOSIS — Z87820 Personal history of traumatic brain injury: Secondary | ICD-10-CM | POA: Insufficient documentation

## 2024-03-23 DIAGNOSIS — R42 Dizziness and giddiness: Secondary | ICD-10-CM | POA: Insufficient documentation

## 2024-03-23 DIAGNOSIS — R5383 Other fatigue: Secondary | ICD-10-CM | POA: Insufficient documentation

## 2024-03-23 DIAGNOSIS — R55 Syncope and collapse: Secondary | ICD-10-CM | POA: Diagnosis present

## 2024-03-23 NOTE — Progress Notes (Signed)
 Subjective:    Patient ID: Norma Wright, female    DOB: 03-06-2001, 23 y.o.   MRN: [id number]  HPI   Norma Wright is a 23 y.o. year old female  who  has a past medical history of Anemia, Fibromyalgia, Heart murmur, Interstitial cystitis, and PID (pelvic inflammatory disease).   They are presenting to PM&R clinic as a follow up for treatment of pain related to chronic headaches, neck pain, and fibromyalgia (pain in bilateral hands to forearms, knees to shins, neck and low back) , complicated by concussion 05-2023 (symptoms now mostly resolved)..   Plan from last visit:  Chronic pain syndrom Fibromyalgia Polyarthralgia   Start Lyrica  for fibromyalgia pain at bedtime to help control symptoms overnight and improve your sleep.  Start with 50 mg capsule at nighttime for 1 week, then if well-tolerated without side effects, increase to 100 mg at nighttime.  Message me or call me within 2 weeks to let me know regarding any potential good or ill effects.   Patient has failed gabapentin and duloxetine  due to no effect.  We talked briefly today about the minalcipran and low-dose naltrexone as alternatives.  Will try Lyrica  first as above.   For acute pain flares, it is okay to use naproxen  or ibuprofen  over-the-counter for few days at a time.  Discussed risks of prolonged daily use including peptic ulcer disease, renal disease, and rebound headaches.   Continue twice weekly pool exercises as recommended by your aqua therapist.  We talked a lot today about energy conservation and fibromyalgia, and the need to balance maintaining aerobic exercise without overdoing it and causing pain exacerbations.  Patient is understanding.   Follow-up with rheumatology as instructed and with me in 3 months.   Primary insomnia   Sleep study referral last time was not approved/scheduled for appointment.  Will follow-up with referral regarding why this was not done.                - Referral denial reviewed: Thank  you for the referral to [facility]. We have reviewed the referral for your patient  Norma Wright and our providers have declined to see the patient. After review it was found the patient completed a sleep study with [facility] in June 2024. Because this is within the last 3 years we are unable to see the patient in our office. Our providers recommend the patient follow up with the previous sleep care provider with any new concerns or ongoing care.      Anxiety state   Agree with assessment by your primary care doctor for medications for anxiety; I will send them our note today to make sure they know it is something we discussed.  I would still call yourself.     Interval Hx:  - Therapies: They told me to take a break; she cancelled her [employer] membership for swimming  because her PCP told her to stop swimming and stop walking due to presyncope. She says walking did cause some issues, but pool exercises never did.    - Follow ups: Cardiology 6/30: 1.  Near syncope-notices about 5 episodes per day where she becomes lightheaded.  We reviewed her Wegovy  use and her significant weight loss.  It appears that her symptoms are multifactorial related to rapid weight loss, lack of protein in her diet, need for increased sodium and p.o. hydration. Increase protein in diet Increase p.o. hydration May use lower extremity support stockings-elastic's support stocking sheet  Low intensity longer duration aerobic activity Increase protein in her diet Increase p.o. hydration, electrolytes 7 day ZIO  -- She says these episodes still haven't gone away. She is drinking 3-4 20 oz bottles of water and liquid IV/coconut water for electrolytes; increasing proteins. She gets episodes every time she stands up and in the shower.--it usually lasts about 2 minutes and has to lay down. In the shower, she has to lay down and get back up 3-4 times to get through a shower--she only  showers with cold water. She has not noticed a difference with hot vs cold weather.   She gets a headache on both sides during these episodes, her vision goes out and she can't hear anything; has not yet lost consciousness but feels on the verge. Headaches will last 10 minutes after an event up to an hour after.    - Falls: none   - DME: none   - Medications: Lyrica  - she stopped taking it because she was put back on Trazodone  and prazosin with her psychiatrist. Lyrica  caused her to be a little tired the next morning, but no other side effects.   No OTC medications at this time. She did not try the voltaren  gel but did buy it.   Recently reduced wegovy  d/t presyncope as above.   - Other concerns:  none  Pain Inventory Average Pain 8 Pain Right Now 7 My pain is sharp, burning, dull, and aching  In the last 24 hours, has pain interfered with the following? General activity 8 Relation with others 8 Enjoyment of life 5 What TIME of day is your pain at its worst? morning  and daytime Sleep (in general) NA  Pain is worse with: walking, sitting, inactivity, and standing Pain improves with: heat/ice Relief from Meds: 6  Family History  Problem Relation Age of Onset   Heart Problems Mother    Asthma Sister    Kidney disease Maternal Grandfather    Asthma Maternal Uncle    Bladder Cancer Neg Hx    Uterine cancer Neg Hx    Social History   Socioeconomic History   Marital status: Single    Spouse name: Not on file   Number of children: Not on file   Years of education: Not on file   Highest education level: GED or equivalent  Occupational History   Not on file  Tobacco Use   Smoking status: Never    Passive exposure: Never   Smokeless tobacco: Never  Vaping Use   Vaping status: Never Used  Substance and Sexual Activity   Alcohol use: No   Drug use: Never   Sexual activity: Never    Partners: Male    Comment: menarche 14yo, never sexually active  Other Topics Concern    Not on file  Social History Narrative   Not on file   Social Drivers of Health   Financial Resource Strain: Medium Risk (10/30/2023)   Received from [facility]   Overall Financial Resource Strain (CARDIA)    Difficulty of Paying Living Expenses: Somewhat hard  Food Insecurity: No Food Insecurity (10/30/2023)   Received from [facility]   Hunger Vital Sign    Within the past 12 months, you worried that your food would run out before you got the money to buy more.: Never true    Within the past 12 months, the food you bought just didn't last and you didn't have money to get more.: Never true  Recent Concern: Food Insecurity - Food Insecurity Present (09/14/2023)   Hunger Vital Sign    Worried About Running Out of Food in the Last Year: Sometimes true    Ran Out of Food in the Last Year: Never true  Transportation Needs: No Transportation Needs (10/30/2023)   Received from [facility] - Transportation    In the past 12 months, has lack of transportation kept you from medical appointments or from getting medications?: No    Lack of Transportation (Non-Medical): No  Physical Activity: Insufficiently Active (09/14/2023)   Exercise Vital Sign    Days of Exercise per Week: 3 days    Minutes of Exercise per Session: 10 min  Stress: No Stress Concern Present (09/14/2023)   [employer] - Occupational Stress Questionnaire    Feeling of Stress : Only a little  Recent Concern: Stress - Stress Concern Present (06/21/2023)   [employer]ational [employer] - Occupational Stress Questionnaire    Feeling of Stress : To some extent  Social Connections: Socially Isolated (09/14/2023)   Social Connection and Isolation Panel    Frequency of Communication with Friends and Family: Twice a week    Frequency of Social Gatherings with Friends and Family: Twice a week    Attends Religious Services: Never     Database administrator or Organizations: No    Attends Engineer, structural: Not on file    Marital Status: Never married   No past surgical history on file. No past surgical history on file. Past Medical History:  Diagnosis Date   Anemia    Fibromyalgia    Heart murmur    Interstitial cystitis    PID (pelvic inflammatory disease)    BP 109/71   Pulse 77   Ht 5' 7 (1.702 m)   Wt 197 lb 6.4 oz (89.5 kg)   SpO2 97%   BMI 30.92 kg/m   Opioid Risk Score:   Fall Risk Score:  `1  Depression screen [facility] 2/9     03/23/2024   11:09 AM 12/13/2023   10:02 AM 12/13/2023    9:27 AM 08/05/2023    1:50 PM 07/22/2023   10:35 AM 06/13/2023    3:05 PM 05/17/2023    4:15 PM  Depression screen PHQ 2/9  Decreased Interest 0 0 0 0 3 1 0  Down, Depressed, Hopeless 0 0 0 0 2 1 0  PHQ - 2 Score 0 0 0 0 5 2 0  Altered sleeping  2   3 3    Tired, decreased energy  3   3 3    Change in appetite  0   3 1   Feeling bad or failure about yourself   0   0 0   Trouble concentrating  2   2 3    Moving slowly or fidgety/restless  0   0 0   Suicidal thoughts  0   0 0   PHQ-9 Score  7   16 12    Difficult doing work/chores  Very difficult    Somewhat difficult      Review of Systems  Musculoskeletal:  Positive for myalgias.       Bilateral shoulders, knees, arms and wrists  All other systems reviewed and are negative.      Objective:   Physical Exam   PE: Constitution: Appropriate appearance for age. No apparent distress +Obese Resp: No respiratory distress. No accessory muscle usage. on RA  and CTAB Cardio: Well perfused appearance. No peripheral edema. Abdomen: Nondistended. Nontender.   Psych: Appropriate mood and affect. Neuro: AAOx4. No apparent cognitive deficits    Neurologic Exam:   DTRs: Reflexes were 2+ in bilateral achilles, patella, biceps, BR and triceps. Babinsky: flexor responses b/l.   Hoffmans: negative b/l Sensory exam: revealed normal sensation in all  dermatomal regions in bilateral upper extremities and bilateral lower extremities Motor exam: strength 5-/5 throughout bilateral upper extremities and bilateral lower extremities Coordination: Fine motor coordination was normal.   Gait: Normal; less antalgic than prior   104/69 HR 70 laying Sitting * symptomatic 109/74 HR 58 Standing *symptomatic 106/65 HR 58    Assessment & Plan:   Safire Gordin is a 23 y.o. year old female  who  has a past medical history of Anemia, Fibromyalgia, Heart murmur, Interstitial cystitis, and PID (pelvic inflammatory disease).   They are presenting to PM&R clinic as a  follow up for pain related to chronic headaches, neck pain, and fibromyalgia (pain in bilateral hands to forearms, knees to shins, neck and low back) , complicated by concussion 05-2023 (symptoms now mostly resolved); also now with increasing presyncopal episodes.   Fibromyalgia Chronic pain syndrome Continue voltaren  gel and tylenol as needed  We discussed duloxetine , minalcipran, and LDN today; will defer given current symptoms  Return to swimming and vestibular exercises as tolerated  Follow up with me in 6 months  Bilateral headaches Other fatigue Postural dizziness with presyncope H/O concussion -     MR BRAIN WO CONTRAST; Future  Ordered Mri brain, will discuss results over mychart  See Neurology at the end of this month; agree this is more likely central than cardiac. Has Zio patch; orthostats negative in office however patient symptomatic.

## 2024-03-23 NOTE — Patient Instructions (Signed)
 Continue voltaren  gel and tylenol as needed  We discussed duloxetine , minalcipran, and LDN today; will defer given current symptoms  Return to swimming and vestibular exercises as tolerated  Ordered Mri brain, will discuss results over mychart  See Neurology at the end of this month; agree this is more likely central than cardiac.  Follow up with me in 6 months

## 2024-03-24 ENCOUNTER — Encounter: Payer: Self-pay | Admitting: Physical Medicine and Rehabilitation

## 2024-04-01 ENCOUNTER — Encounter: Payer: Self-pay | Admitting: Nurse Practitioner

## 2024-04-02 ENCOUNTER — Ambulatory Visit
Admission: RE | Admit: 2024-04-02 | Discharge: 2024-04-02 | Disposition: A | Discharge status: Home / Self Care | Attending: Nurse Practitioner | Admitting: Nurse Practitioner

## 2024-04-02 ENCOUNTER — Other Ambulatory Visit: Payer: Self-pay

## 2024-04-02 VITALS — BP 113/75 | HR 79 | Temp 98.6°F | Resp 18 | Ht 67.0 in | Wt 196.0 lb

## 2024-04-02 DIAGNOSIS — N6082 Other benign mammary dysplasias of left breast: Secondary | ICD-10-CM | POA: Diagnosis not present

## 2024-04-02 NOTE — ED Provider Notes (Signed)
 [facility]    CSN: [id number] Arrival date & time: 04/02/24  1250      History   Chief Complaint Chief Complaint  Patient presents with   Breast Problem    I have large red spot on my chest that hasn't gone away for a week. - Entered by patient    HPI Norma Wright is a 23 y.o. female.   HPI  Pt reports concern for a red, raised bump on her chest that has been present for 1-2 weeks She states the area was mildly sore but not painful  She does not think it has resolved at this point  She reports previous hx of cystic acne but thinks this area is different from her typical acne flares She denies drainage, bleeding but is concerned for increasing redness to the area    Past Medical History:  Diagnosis Date   Anemia    Fibromyalgia    Heart murmur    Interstitial cystitis    PID (pelvic inflammatory disease)     Patient Active Problem List   Diagnosis Date Noted   H/O concussion 03/23/2024   Postural dizziness with presyncope 12/25/2023   Hemorrhoids 12/25/2023   Vitamin B12 deficiency 12/13/2023   Vitamin D  deficiency 12/13/2023   Iron deficiency 12/13/2023   Primary insomnia 12/02/2023   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   Oral contraceptive use 09/16/2023   Other fatigue 09/02/2023   Left lateral epicondylitis 09/02/2023   Myofascial neck pain 08/05/2023   Interstitial cystitis 08/01/2023   Chronic pain syndrome 07/22/2023   Bilateral headaches 07/22/2023   Adjustment disorder with depressed mood 07/22/2023   Fibromyalgia 06/24/2023   Concussion 06/13/2023   Chronic female pelvic pain 05/17/2023   Dysuria 05/17/2023   Physical deconditioning 07/25/2022   Intermittent palpitations 07/18/2022   Chest pain 07/18/2022   DOE (dyspnea on exertion) 07/18/2022   Dysmenorrhea 07/18/2022   Excessive hair growth 07/18/2022   Obesity (BMI 30-39.9) 07/18/2022   Hot flashes 06/27/2022   Abnormal bruising 06/27/2022   Generalized anxiety disorder 06/27/2022    Positive ANA (antinuclear antibody) 06/19/2022   Sedimentation rate elevation 06/05/2022   Polyarthralgia 06/05/2022   Constipation 03/08/2022   LLQ pain 03/08/2022   Cystic acne 07/31/2021    History reviewed. No pertinent surgical history.  OB History     Gravida  0   Para  0   Term  0   Preterm  0   AB  0   Living  0      SAB  0   IAB  0   Ectopic  0   Multiple  0   Live Births  0            Home Medications    Prior to Admission medications   Medication Sig Start Date End Date Taking? Authorizing Provider  FLUoxetine (PROZAC) 40 MG capsule Take 40 mg by mouth every morning. 02/26/24   [provider]  lidocaine -prilocaine  (EMLA ) cream Apply 1 Application topically as needed. Apply cream to skin to assist with pain 02/05/24   Zuleta, Kaitlin G, NP  Melatonin 5 MG CHEW Chew by mouth. Takes 2 at night    [provider]  Multiple Vitamin (MULTI-VITAMIN) tablet Take 1 tablet by mouth daily.    [provider]  Norethindrone-Ethinyl Estradiol -Fe Biphas (LO LOESTRIN FE) 1 MG-10 MCG / 10 MCG tablet Take 1 tablet by mouth daily. 11/08/23   Chrzanowski, Jami B, NP  prazosin (MINIPRESS) 1 MG capsule Take  1-2 mg by mouth at bedtime. 02/26/24   [provider]  Semaglutide -Weight Management (WEGOVY ) 0.5 MG/0.5ML SOAJ Inject 0.5 mg into the skin once a week. 03/12/24   McElwee, Lauren A, NP  traZODone  (DESYREL ) 50 MG tablet TAKE 1/2-1 TABLET BY MOUTH AT BEDTIME 01/16/24   [provider]  VITAMIN D  PO Take by mouth once a week.    [provider]    Family History Family History  Problem Relation Age of Onset   Heart Problems Mother    Asthma Sister    Kidney disease Maternal Grandfather    Asthma Maternal Uncle    Bladder Cancer Neg Hx    Uterine cancer Neg Hx     Social History Social History   Tobacco Use   Smoking status: Never    Passive exposure: Never   Smokeless tobacco: Never  Vaping Use    Vaping status: Never Used  Substance Use Topics   Alcohol use: No   Drug use: Never     Allergies   Shellfish allergy    Review of Systems Review of Systems  Skin:  Positive for color change.       Concern for red bump on chest      Physical Exam Triage Vital Signs ED Triage Vitals  Encounter Vitals Group     BP 04/02/24 1258 113/75     Girls Systolic BP Percentile --      Girls Diastolic BP Percentile --      Boys Systolic BP Percentile --      Boys Diastolic BP Percentile --      Pulse Rate 04/02/24 1258 79     Resp 04/02/24 1258 18     Temp 04/02/24 1258 98.6 F (37 C)     Temp Source 04/02/24 1258 Oral     SpO2 04/02/24 1258 97 %     Weight 04/02/24 1258 196 lb (88.9 kg)     Height 04/02/24 1258 5' 7 (1.702 m)     Head Circumference --      Peak Flow --      Pain Score 04/02/24 1310 0     Pain Loc --      Pain Education --      Exclude from Growth Chart --    No data found.  Updated Vital Signs BP 113/75 (BP Location: Right Arm)   Pulse 79   Temp 98.6 F (37 C) (Oral)   Resp 18   Ht 5' 7 (1.702 m)   Wt 196 lb (88.9 kg)   LMP 03/07/2024 (Exact Date)   SpO2 97%   BMI 30.70 kg/m   Visual Acuity Right Eye Distance:   Left Eye Distance:   Bilateral Distance:    Right Eye Near:   Left Eye Near:    Bilateral Near:     Physical Exam Vitals reviewed.  Constitutional:      General: She is awake.     Appearance: Normal appearance. She is well-developed and well-groomed.  HENT:     Head: Normocephalic and atraumatic.  Eyes:     General: Lids are normal. Gaze aligned appropriately.     Extraocular Movements: Extraocular movements intact.     Conjunctiva/sclera: Conjunctivae normal.  Pulmonary:     Effort: Pulmonary effort is normal.  Chest:  Breasts:    Left: Skin change present.       Comments: Patient declined chaperone for examination of breast lesion Neurological:     Mental Status: She is  alert and oriented to person, place, and  time.  Psychiatric:        Attention and Perception: Attention and perception normal.        Mood and Affect: Mood and affect normal.        Speech: Speech normal.        Behavior: Behavior normal. Behavior is cooperative.      UC Treatments / Results  Labs (all labs ordered are listed, but only abnormal results are displayed) Labs Reviewed - No data to display  EKG   Radiology No results found.  Procedures Procedures (including critical care time)  Medications Ordered in UC Medications - No data to display  Initial Impression / Assessment and Plan / UC Course  I have reviewed the triage vital signs and the nursing notes.  Pertinent labs & imaging results that were available during my care of the patient were reviewed by me and considered in my medical decision making (see chart for details).      Final Clinical Impressions(s) / UC Diagnoses   Final diagnoses:  Cyst of skin of breast, left   Patient presents today with concerns for a cyst of the skin of the left breast that has been present for several days.  She is concerned that the area appears to be getting larger.  Physical exam is notable for 2 cm diameter indurated sebaceous cyst of the left breast.  No signs of erythema, swelling, drainage or bleeding.  Reviewed with patient that this area does not appear conducive to I&D at this time.  Recommend warm compresses keeping the area clean with warm water and gentle cleanser.  Recommend applying benzyl peroxide or salicylic acid to the area to assist with resolution.  Return precautions reviewed and provided in AVS.  Follow-up as needed    Discharge Instructions      You were seen today for concerns of a lump on your left breast.  Your physical exam appears most consistent with a cyst or boil of the skin.  In young patients who are predominantly healthy these will typically resolve without further management or intervention.  To help with resolution I recommend warm  compresses to the area, keeping the area clean and dry with warm water and gentle soap.  Please avoid manipulating or squeezing area as this can cause further inflammation and potential infection.  To help treat cysts and acne I recommend using the salicylic acid or benzyl peroxide acne treatments.  Please use these in small amounts at first to see how your skin will react and then use on larger areas once you are sure that it will not irritate your skin. If the affected area on your chest becomes more red, increasingly tender, shows signs of drainage or bleeding you can always return to urgent care for further evaluation and management.     ED Prescriptions   None    PDMP not reviewed this encounter.   Alvah Gilder E, PA-C 04/02/24 1934

## 2024-04-02 NOTE — ED Triage Notes (Addendum)
 Pt presents to urgent care after feeling a knot/rash around left breast area. Noticed this about a week and half ago. Pt is concerned as she believes it may be growing in size. Denies pain, states the area is just sore at times. Has not been sore in about five days though.

## 2024-04-02 NOTE — Discharge Instructions (Signed)
 You were seen today for concerns of a lump on your left breast.  Your physical exam appears most consistent with a cyst or boil of the skin.  In young patients who are predominantly healthy these will typically resolve without further management or intervention.  To help with resolution I recommend warm compresses to the area, keeping the area clean and dry with warm water and gentle soap.  Please avoid manipulating or squeezing area as this can cause further inflammation and potential infection.  To help treat cysts and acne I recommend using the salicylic acid or benzyl peroxide acne treatments.  Please use these in small amounts at first to see how your skin will react and then use on larger areas once you are sure that it will not irritate your skin. If the affected area on your chest becomes more red, increasingly tender, shows signs of drainage or bleeding you can always return to urgent care for further evaluation and management.

## 2024-04-07 ENCOUNTER — Ambulatory Visit
Admission: RE | Admit: 2024-04-07 | Discharge: 2024-04-07 | Disposition: A | Discharge status: Home / Self Care | Source: Ambulatory Visit | Attending: Physical Medicine and Rehabilitation | Admitting: Physical Medicine and Rehabilitation

## 2024-04-07 DIAGNOSIS — R42 Dizziness and giddiness: Secondary | ICD-10-CM

## 2024-04-07 DIAGNOSIS — Z87820 Personal history of traumatic brain injury: Secondary | ICD-10-CM

## 2024-04-07 DIAGNOSIS — R519 Headache, unspecified: Secondary | ICD-10-CM

## 2024-04-08 ENCOUNTER — Ambulatory Visit (INDEPENDENT_AMBULATORY_CARE_PROVIDER_SITE_OTHER): Admitting: Neurology

## 2024-04-08 ENCOUNTER — Encounter: Payer: Self-pay | Admitting: Neurology

## 2024-04-08 VITALS — BP 92/64 | HR 82 | Ht 67.0 in | Wt 196.0 lb

## 2024-04-08 DIAGNOSIS — R55 Syncope and collapse: Secondary | ICD-10-CM | POA: Diagnosis not present

## 2024-04-08 DIAGNOSIS — R42 Dizziness and giddiness: Secondary | ICD-10-CM

## 2024-04-08 DIAGNOSIS — R6889 Other general symptoms and signs: Secondary | ICD-10-CM | POA: Diagnosis not present

## 2024-04-08 DIAGNOSIS — R404 Transient alteration of awareness: Secondary | ICD-10-CM
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
## 2024-04-08 NOTE — Patient Instructions (Addendum)
 Unfortunately, lightheadedness is a very common complaint but is often not due to a primary neurological reason or single underlying medical problem. Often, there a combination of factors, that result in dizziness or lightheadedness. This includes blood pressure fluctuations, medication side effects, blood sugar fluctuations, stress, vertigo, poor sleep with sleep deprivation, dehydration, and electrolyte disturbance or other metabolic and endocrinological reasons, meaning hormone related problems such as thyroid  dysfunction. We will investigate things further with an EEG (brainwave test), which we will schedule. We will call you with the results. I recommend you proceed with the heart monitor.  I recommend you follow up with Dr. Emeline as planned for your headaches and chronic pain.  Talk to your psychiatrist about your medications, as prazosin and trazodone  can cause drowsiness and dizziness.  So long as your EEG is benign, we an follow up in this clinic as needed.  Please do not drive when feeling sleepy, please do not drive when feeling faint.

## 2024-04-09 ENCOUNTER — Ambulatory Visit: Payer: Self-pay | Admitting: Neurology

## 2024-04-09 ENCOUNTER — Ambulatory Visit (INDEPENDENT_AMBULATORY_CARE_PROVIDER_SITE_OTHER): Admitting: Neurology

## 2024-04-09 ENCOUNTER — Ambulatory Visit: Payer: Self-pay | Admitting: Physical Medicine and Rehabilitation

## 2024-04-09 DIAGNOSIS — R6889 Other general symptoms and signs: Secondary | ICD-10-CM

## 2024-04-09 DIAGNOSIS — R42 Dizziness and giddiness: Secondary | ICD-10-CM

## 2024-04-09 DIAGNOSIS — R4182 Altered mental status, unspecified: Secondary | ICD-10-CM

## 2024-04-09 DIAGNOSIS — R404 Transient alteration of awareness: Secondary | ICD-10-CM

## 2024-04-09 NOTE — Procedures (Signed)
    History:  23 year old woman with presyncope. EEG to evaluate for seizure   EEG classification: Awake and drowsy  Duration: 26 minutes   Technical aspects: This EEG study was done with scalp electrodes positioned according to the 10-20 International system of electrode placement. Electrical activity was reviewed with band pass filter of 1-70Hz , sensitivity of 7 uV/mm, display speed of 31mm/sec with a 60Hz  notched filter applied as appropriate. EEG data were recorded continuously and digitally stored.   Description of the recording: The background rhythms of this recording consists of a fairly well modulated medium amplitude alpha rhythm of 10 Hz that is reactive to eye opening and closure. Present in the anterior head region is a 15-20 Hz beta activity. Photic stimulation was performed, did not show any abnormalities. Hyperventilation was also performed, did not show any abnormalities. Drowsiness was not seen. No abnormal epileptiform discharges seen during this recording. There was no focal slowing. There were no electrographic seizure identified.   Abnormality: None   Impression: This is a normal awake EEG. No evidence of interictal epileptiform discharges. Normal EEGs, however, do not rule out epilepsy.    Dekota Kirlin, MD [facility]

## 2024-04-12 ENCOUNTER — Encounter: Payer: Self-pay | Admitting: Nurse Practitioner

## 2024-04-13 MED ORDER — WEGOVY 0.5 MG/0.5ML ~~LOC~~ SOAJ: 0.5000 mg | SUBCUTANEOUS | 1 refills | Status: DC

## 2024-04-13 NOTE — Telephone Encounter (Signed)
 Requesting: Wegovy  Last Visit: 02/17/2024 Next Visit: 04/20/2024 Last Refill: 03/12/2024  Please Advise

## 2024-04-20 ENCOUNTER — Ambulatory Visit (INDEPENDENT_AMBULATORY_CARE_PROVIDER_SITE_OTHER): Admitting: Nurse Practitioner

## 2024-04-20 ENCOUNTER — Encounter: Payer: Self-pay | Admitting: Nurse Practitioner

## 2024-04-20 ENCOUNTER — Other Ambulatory Visit (HOSPITAL_COMMUNITY): Payer: Self-pay

## 2024-04-20 VITALS — BP 100/62 | HR 78 | Temp 97.3°F | Ht 67.0 in | Wt 194.2 lb

## 2024-04-20 DIAGNOSIS — R42 Dizziness and giddiness: Secondary | ICD-10-CM | POA: Diagnosis not present

## 2024-04-20 DIAGNOSIS — R55 Syncope and collapse: Secondary | ICD-10-CM

## 2024-04-20 DIAGNOSIS — E538 Deficiency of other specified B group vitamins: Secondary | ICD-10-CM

## 2024-04-20 DIAGNOSIS — F411 Generalized anxiety disorder: Secondary | ICD-10-CM

## 2024-04-20 MED ORDER — WEGOVY 0.5 MG/0.5ML ~~LOC~~ SOAJ
0.5000 mg | SUBCUTANEOUS | 2 refills | Status: DC
  Filled 2024-04-20 (×2): qty 2, 28d supply, fill #0

## 2024-04-20 MED ORDER — NEEDLE (DISP) 23G X 1" MISC
1.0000 | 0 refills | Status: DC
  Filled 2024-04-20: qty 1, 30d supply, fill #0

## 2024-04-20 MED ORDER — CYANOCOBALAMIN 1000 MCG/ML IJ SOLN: 1000.0000 ug | Freq: Once | INTRAMUSCULAR | 0 refills | Status: DC

## 2024-04-20 MED ORDER — CYANOCOBALAMIN 1000 MCG/ML IJ SOLN
1000.0000 ug | Freq: Once | INTRAMUSCULAR | 0 refills | Status: AC
  Filled 2024-04-20: qty 1, 30d supply, fill #0
  Filled 2024-04-20: qty 1, 1d supply, fill #0

## 2024-04-20 MED ORDER — NEEDLE (DISP) 23G X 1" MISC: 1.0000 | 0 refills | Status: DC

## 2024-04-20 MED ORDER — CYANOCOBALAMIN 1000 MCG/ML IJ SOLN
1000.0000 ug | Freq: Once | INTRAMUSCULAR | Status: AC
  Administered 2024-04-20: 1000 ug via INTRAMUSCULAR

## 2024-04-20 NOTE — Progress Notes (Signed)
 Established Patient Office Visit  Subjective   Patient ID: Norma Wright, female    DOB: [birth year]  Age: 23 y.o. MRN: [id number]  Chief Complaint  Patient presents with   Weight Management and Light headed    Follow up, request testing for POTS, concerns with slow weight loss    HPI Discussed the use of AI scribe software for clinical note transcription with the patient, who gave verbal consent to proceed.  History of Present Illness   Norma Wright is a 23 year old female who presents with episodes of feeling faint and near syncope.  She experiences these episodes particularly when walking or climbing stairs, affecting her daily activities and exercise. She nearly fainted while walking up stairs, requiring assistance.  An EEG, MRI, and two CT scans have returned normal results. A heart monitor was also worn, with expected normal findings. Despite these, symptoms persist.  She took one dose of fluoxetine after a neurologist suggested anxiety might contribute to her symptoms. She felt less faint after consuming a high-salt meal, indicating a possible dietary link.       ROS See pertinent positives and negatives per HPI.    Objective:     BP 100/62 (BP Location: Left Arm, Patient Position: Sitting, Cuff Size: Normal)   Pulse 78   Temp (!) 97.3 F (36.3 C)   Ht 5' 7 (1.702 m)   Wt 194 lb 3.2 oz (88.1 kg)   LMP 04/06/2024 (Exact Date)   SpO2 100%   BMI 30.42 kg/m  BP Readings from Last 3 Encounters:  04/20/24 100/62  04/08/24 92/64  04/02/24 113/75   Wt Readings from Last 3 Encounters:  04/20/24 194 lb 3.2 oz (88.1 kg)  04/08/24 196 lb (88.9 kg)  04/02/24 196 lb (88.9 kg)   Orthostatic Vitals for the past 48 hrs (Last 6 readings):  Patient Position Orthostatic BP Orthostatic Pulse BP Pulse BP Location Cuff Size  04/20/24 1319 Sitting -- -- 100/62 78 Left Arm Normal  04/20/24 1407 Supine 102/66 60 -- -- Left Arm Normal  04/20/24 1408 Sitting 98/64 73 -- -- Left  Arm Normal  04/20/24 1409 Standing 96/64 91 -- -- Left Arm Normal     Physical Exam Vitals and nursing note reviewed.  Constitutional:      General: She is not in acute distress.    Appearance: Normal appearance.  HENT:     Head: Normocephalic.  Eyes:     Conjunctiva/sclera: Conjunctivae normal.  Cardiovascular:     Rate and Rhythm: Normal rate and regular rhythm.     Pulses: Normal pulses.     Heart sounds: Normal heart sounds.  Pulmonary:     Effort: Pulmonary effort is normal.     Breath sounds: Normal breath sounds.  Musculoskeletal:     Cervical back: Normal range of motion.  Skin:    General: Skin is warm.  Neurological:     General: No focal deficit present.     Mental Status: She is alert and oriented to person, place, and time.  Psychiatric:        Mood and Affect: Mood normal.        Behavior: Behavior normal.        Thought Content: Thought content normal.        Judgment: Judgment normal.      Assessment & Plan:   Problem List Items Addressed This Visit       Cardiovascular and Mediastinum   Postural dizziness with presyncope  Recurrent presyncope with hypotension may be due to POTS, anxiety, or dietary factors. EEG, Zio, and MRI of brain unrevealing. Increased salt and fluid intake has improved symptoms. Orthostatics normal today. Increase dietary salt and fluid intake to 2-3 liters daily. Consider compression socks. Follow-up with cardiology in 10 days.        Other   Generalized anxiety disorder   Anxiety may contribute to presyncope. Fluoxetine has not been taken regularly and requires consistent intake for effect. Start taking fluoxetine 40mg  daily as prescribed. Continue following with psychiatry.       Vitamin B12 deficiency - Primary   Vitamin B12 deficiency is managed with monthly injections. Discussed self-administration to avoid clinic visits. Administer B12 injection today and teach self-administration of B12 injections.       Relevant  Medications   cyanocobalamin  (VITAMIN B12) 1000 MCG/ML injection   NEEDLE, DISP, 23 G 23G X 1 MISC   Return in about 3 months (around 07/21/2024) for weight management .    [name], NP

## 2024-04-20 NOTE — Assessment & Plan Note (Signed)
 Vitamin B12 deficiency is managed with monthly injections. Discussed self-administration to avoid clinic visits. Administer B12 injection today and teach self-administration of B12 injections.

## 2024-04-20 NOTE — Assessment & Plan Note (Signed)
 Anxiety may contribute to presyncope. Fluoxetine has not been taken regularly and requires consistent intake for effect. Start taking fluoxetine 40mg  daily as prescribed. Continue following with psychiatry.

## 2024-04-20 NOTE — Patient Instructions (Addendum)
 It was great to see you!  Try to increase the salt in your diet and make sure you are drinking 2 liters of fluid in a day   Keep the appointment with your cardiologist in 2 weeks   Start fluoxetine 1 capsule daily   Let's follow-up in 3 months, sooner if you have concerns.  If a referral was placed today, you will be contacted for an appointment. Please note that routine referrals can sometimes take up to 3-4 weeks to process. Please call our office if you haven't heard anything after this time frame.  Take care,  Tinnie Harada, NP

## 2024-04-20 NOTE — Assessment & Plan Note (Signed)
 Recurrent presyncope with hypotension may be due to POTS, anxiety, or dietary factors. EEG, Zio, and MRI of brain unrevealing. Increased salt and fluid intake has improved symptoms. Orthostatics normal today. Increase dietary salt and fluid intake to 2-3 liters daily. Consider compression socks. Follow-up with cardiology in 10 days.

## 2024-04-22 ENCOUNTER — Ambulatory Visit

## 2024-04-28 ENCOUNTER — Ambulatory Visit: Admitting: Neurology

## 2024-04-30 ENCOUNTER — Ambulatory Visit: Payer: Self-pay | Admitting: General Practice

## 2024-04-30 ENCOUNTER — Ambulatory Visit: Admitting: General Practice

## 2024-04-30 DIAGNOSIS — R002 Palpitations: Secondary | ICD-10-CM | POA: Diagnosis not present

## 2024-04-30 DIAGNOSIS — R55 Syncope and collapse: Secondary | ICD-10-CM | POA: Diagnosis not present

## 2024-05-17 ENCOUNTER — Encounter: Payer: Self-pay | Admitting: Nurse Practitioner

## 2024-05-20 ENCOUNTER — Encounter: Payer: Self-pay | Admitting: Physical Medicine and Rehabilitation

## 2024-05-20 ENCOUNTER — Encounter: Payer: Self-pay | Admitting: Neurology

## 2024-05-22 ENCOUNTER — Ambulatory Visit: Admitting: Nurse Practitioner

## 2024-05-27 ENCOUNTER — Ambulatory Visit: Attending: Cardiovascular Disease | Admitting: Emergency Medicine

## 2024-05-27 ENCOUNTER — Encounter: Payer: Self-pay | Admitting: Emergency Medicine

## 2024-05-27 VITALS — BP 109/72 | HR 80 | Ht 67.0 in | Wt 193.0 lb

## 2024-05-27 DIAGNOSIS — R42 Dizziness and giddiness: Secondary | ICD-10-CM | POA: Diagnosis present

## 2024-05-27 DIAGNOSIS — R55 Syncope and collapse: Secondary | ICD-10-CM | POA: Diagnosis present

## 2024-05-27 NOTE — Patient Instructions (Addendum)
 Medication Instructions:  Your physician recommends that you continue on your current medications as directed. Please refer to the Current Medication list given to you today. *If you need a refill on your cardiac medications before your next appointment, please call your pharmacy*  Lab Work: None ordered If you have labs (blood work) drawn today and your tests are completely normal, you will receive your results only by: MyChart Message (if you have MyChart) OR A paper copy in the mail If you have any lab test that is abnormal or we need to change your treatment, we will call you to review the results.  Testing/Procedures: Your physician has requested that you have an exercise tolerance test. For further information please visit [email or web address] Please also follow instruction sheet, as given.  Follow-Up: At [facility], you and your health needs are our priority.  As part of our continuing mission to provide you with exceptional heart care, our providers are all part of one team.  This team includes your primary Cardiologist (physician) and Advanced Practice Providers or APPs (Physician Assistants and Nurse Practitioners) who all work together to provide you with the care you need, when you need it.  Your next appointment:   3 month(s)  Provider:   Lum Louis, NP    We recommend signing up for the patient portal called MyChart.  Sign up information is provided on this After Visit Summary.  MyChart is used to connect with patients for Virtual Visits (Telemedicine).  Patients are able to view lab/test results, encounter notes, upcoming appointments, etc.  Non-urgent messages can be sent to your provider as well.   To learn more about what you can do with MyChart, go to [email or web address].   Other Instructions
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
## 2024-06-01 ENCOUNTER — Encounter (HOSPITAL_COMMUNITY): Payer: Self-pay

## 2024-06-12 NOTE — Addendum Note (Signed)
 Addended by: Raeanne Deschler L on: 06/12/2024 04:37 PM   Modules accepted: Orders

## 2024-06-16 ENCOUNTER — Ambulatory Visit (HOSPITAL_COMMUNITY)
Admission: RE | Admit: 2024-06-16 | Discharge: 2024-06-16 | Disposition: A | Discharge status: Home / Self Care | Source: Ambulatory Visit | Attending: Emergency Medicine | Admitting: Emergency Medicine

## 2024-06-16 DIAGNOSIS — R55 Syncope and collapse: Secondary | ICD-10-CM | POA: Diagnosis present

## 2024-06-16 LAB — EXERCISE TOLERANCE TEST
Angina Index: 0
Duke Treadmill Score: 2
Estimated workload: 4.6
Exercise duration (min): 2 min
Exercise duration (sec): 0 s
MPHR: 199 {beats}/min
Peak HR: 139 {beats}/min
Percent HR: 69 %
Rest HR: 74 {beats}/min
ST Depression (mm): 0 mm

## 2024-06-17 ENCOUNTER — Telehealth: Payer: Self-pay | Admitting: Internal Medicine

## 2024-06-17 NOTE — Telephone Encounter (Signed)
 Patient called to follow-up on her Exercise Tolerance Test test results.

## 2024-06-17 NOTE — Telephone Encounter (Signed)
 Left message for patient that ETT results are not finalized. Routed to [facility] NP  ETT notes:   Exercise capacity was severely impaired. Patient exercised for 2 min and 0 sec. Maximum HR of 139 bpm. MPHR 69.0%. Peak METS 4.6.   No ST deviation was noted. The ECG was not diagnostic due to failure to achieve 85% MAPHR.   Prior study not available for comparison.   Nondiagnostic due to failure to achieve target heart rate. Poor exercise tolerance. Given low METs, cannot definitively determine chronotropic incompetence, as patient was able to increase heart rate to 139 bpm (70% APMHR) despite low METs.

## 2024-06-18 ENCOUNTER — Ambulatory Visit: Payer: Self-pay | Admitting: Emergency Medicine

## 2024-06-30 ENCOUNTER — Encounter: Payer: Self-pay | Admitting: *Deleted

## 2024-07-01 ENCOUNTER — Ambulatory Visit: Admitting: Emergency Medicine
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
## 2024-07-06 ENCOUNTER — Other Ambulatory Visit: Payer: Self-pay | Admitting: Nurse Practitioner

## 2024-07-06 NOTE — Telephone Encounter (Signed)
 Requesting: WEGOVY  0.5MG /0.5ML INJ (4 PENS)  Last Visit: 04/20/2024 Next Visit: 07/20/2024 Last Refill: 04/20/2024  Please Advise

## 2024-07-09 ENCOUNTER — Encounter: Payer: Self-pay | Admitting: Student

## 2024-07-09 ENCOUNTER — Ambulatory Visit: Attending: Student | Admitting: Cardiology

## 2024-07-09 VITALS — BP 102/64 | HR 73 | Ht 67.0 in | Wt 193.2 lb

## 2024-07-09 DIAGNOSIS — R55 Syncope and collapse: Secondary | ICD-10-CM | POA: Diagnosis not present

## 2024-07-09 DIAGNOSIS — R42 Dizziness and giddiness: Secondary | ICD-10-CM | POA: Diagnosis not present

## 2024-07-09 DIAGNOSIS — R002 Palpitations: Secondary | ICD-10-CM | POA: Insufficient documentation
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
## 2024-07-09 NOTE — Patient Instructions (Addendum)
 Thank you for choosing [facility]!     Medication Instructions:  Remember to wear your compression stockings and abdominal compression.  Remember to stay well hydrated.  *If you need a refill on your cardiac medications before your next appointment, please call your pharmacy*   Lab Work: Labs will be drawn today.................... IRON PANEL  If you have labs (blood work) drawn today and your tests are completely normal, you will receive your results only by: MyChart Message (if you have MyChart) OR A paper copy in the mail If you have any lab test that is abnormal or we need to change your treatment, we will call you to review the results.   Testing/Procedures: No procedures were ordered during today's visit.   Your next appointment:   4 month(s)   Provider:   Arun K [name], MD     Follow-Up: At [facility], you and your health needs are our priority.  As part of our continuing mission to provide you with exceptional heart care, we have created designated Provider Care Teams.  These Care Teams include your primary Cardiologist (physician) and Advanced Practice Providers (APPs -  Physician Assistants and Nurse Practitioners) who all work together to provide you with the care you need, when you need it. We recommend signing up for the patient portal called MyChart.  Sign up information is provided on this After Visit Summary.  MyChart is used to connect with patients for Virtual Visits (Telemedicine).  Patients are able to view lab/test results, encounter notes, upcoming appointments, etc.  Non-urgent messages can be sent to your provider as well.   To learn more about what you can do with MyChart, go to [email or web address].

## 2024-07-10 ENCOUNTER — Ambulatory Visit: Payer: Self-pay | Admitting: Cardiology

## 2024-07-10 LAB — IRON,TIBC AND FERRITIN PANEL
Ferritin: 32 ng/mL (ref 15–150)
Iron Saturation: 38 % (ref 15–55)
Iron: 103 ug/dL (ref 27–159)
Total Iron Binding Capacity: 273 ug/dL (ref 250–450)
UIBC: 170 ug/dL (ref 131–425)

## 2024-07-20 ENCOUNTER — Encounter: Payer: Self-pay | Admitting: Nurse Practitioner

## 2024-07-20 ENCOUNTER — Ambulatory Visit: Payer: Self-pay | Admitting: Nurse Practitioner

## 2024-07-20 ENCOUNTER — Ambulatory Visit: Admitting: Nurse Practitioner

## 2024-07-20 VITALS — BP 110/62 | HR 76 | Temp 97.6°F | Ht 67.0 in | Wt 193.8 lb

## 2024-07-20 DIAGNOSIS — R55 Syncope and collapse: Secondary | ICD-10-CM

## 2024-07-20 DIAGNOSIS — E538 Deficiency of other specified B group vitamins: Secondary | ICD-10-CM

## 2024-07-20 DIAGNOSIS — E559 Vitamin D deficiency, unspecified: Secondary | ICD-10-CM

## 2024-07-20 DIAGNOSIS — R42 Dizziness and giddiness: Secondary | ICD-10-CM | POA: Diagnosis not present

## 2024-07-20 LAB — VITAMIN B12: Vitamin B-12: 281 pg/mL (ref 211–911)

## 2024-07-20 LAB — VITAMIN D 25 HYDROXY (VIT D DEFICIENCY, FRACTURES): VITD: 23.47 ng/mL — ABNORMAL LOW (ref 30.00–100.00)

## 2024-07-20 MED ORDER — VITAMIN D (ERGOCALCIFEROL) 1.25 MG (50000 UNIT) PO CAPS: 50000.0000 [IU] | ORAL_CAPSULE | ORAL | 0 refills | Status: AC

## 2024-07-20 NOTE — Assessment & Plan Note (Signed)
 She has not taken her prescribed vitamin D  for months, and her vitamin D  levels need reassessment. A vitamin D  level test is ordered. A prescription for vitamin D  will be sent if levels are low.

## 2024-07-20 NOTE — Assessment & Plan Note (Signed)
 Chronic orthostatic hypotension causes dizziness when she changes positions. Although the cardiologist suspects orthostatic hypotension, tests have not confirmed the cause. She should wear appropriately sized compression socks, change positions slowly, and increase fluid intake. Continue collaboration and recommendations from cardiology.

## 2024-07-20 NOTE — Addendum Note (Signed)
 Addended by: Izza Bickle A on: 07/20/2024 02:54 PM   Modules accepted: Orders

## 2024-07-20 NOTE — Progress Notes (Addendum)
 Established Patient Office Visit  Subjective   Patient ID: Norma Wright, female    DOB: 08-Aug-2001  Age: 23 y.o. MRN: [id number]  Chief Complaint  Patient presents with   Weight Managment    Follow up, concerns with feeling light headed, Rx refill of Vitamin D     HPI Discussed the use of AI scribe software for clinical note transcription with the patient, who gave verbal consent to proceed.  History of Present Illness   Norma Wright is a 23 year old female who presents with dizziness and near syncope upon standing.  She experiences dizziness and near syncope multiple times daily upon standing, persisting since last year. Her heart rate, measured by her Apple Watch, is 86 bpm at rest and increases to 156 bpm upon standing, reaching 190 bpm during activities like showering. She owns compression socks but finds them difficult to use.  Previous workup included a heart monitor and treadmill test, where she stopped after two minutes due to a drop in blood pressure to 76 mmHg, though this was not documented. Her maximum heart rate during the test was 139 bpm, with a blood pressure of 107/65 mmHg. She experiences a sensation of her heart racing but no chest pain or syncope.  Her iron levels are currently normal. She has not been taking her prescribed vitamin D  due to pharmacy issues. She has low B12 levels and is apprehensive about self-administering B12 shots. She is increasing her fiber and protein intake but finds protein consumption difficult. She has maintained weight loss despite stopping Wegovy  due to insurance issues.       ROS See pertinent positives and negatives per HPI.    Objective:     BP 110/62 (BP Location: Left Arm, Patient Position: Sitting, Cuff Size: Large)   Pulse 76   Temp 97.6 F (36.4 C)   Ht 5' 7 (1.702 m)   Wt 193 lb 12.8 oz (87.9 kg)   LMP 06/30/2024 (Approximate)   SpO2 98%   BMI 30.35 kg/m  BP Readings from Last 3 Encounters:  07/20/24 110/62   07/09/24 102/64  05/27/24 109/72   Wt Readings from Last 3 Encounters:  07/20/24 193 lb 12.8 oz (87.9 kg)  07/09/24 193 lb 3.2 oz (87.6 kg)  05/27/24 193 lb (87.5 kg)      Physical Exam Vitals and nursing note reviewed.  Constitutional:      General: She is not in acute distress.    Appearance: Normal appearance.  HENT:     Head: Normocephalic.  Eyes:     Conjunctiva/sclera: Conjunctivae normal.  Cardiovascular:     Rate and Rhythm: Normal rate and regular rhythm.     Pulses: Normal pulses.     Heart sounds: Normal heart sounds.  Pulmonary:     Effort: Pulmonary effort is normal.     Breath sounds: Normal breath sounds.  Musculoskeletal:     Cervical back: Normal range of motion.  Skin:    General: Skin is warm.  Neurological:     General: No focal deficit present.     Mental Status: She is alert and oriented to person, place, and time.  Psychiatric:        Mood and Affect: Mood normal.        Behavior: Behavior normal.        Thought Content: Thought content normal.        Judgment: Judgment normal.      Assessment & Plan:   Problem List Items Addressed  This Visit       Cardiovascular and Mediastinum   Postural dizziness with presyncope - Primary   Chronic orthostatic hypotension causes dizziness when she changes positions. Although the cardiologist suspects orthostatic hypotension, tests have not confirmed the cause. She should wear appropriately sized compression socks, change positions slowly, and increase fluid intake. Continue collaboration and recommendations from cardiology.         Other   Vitamin B12 deficiency   She is currently not taking any supplements and does not like to give herself injections. Chcek B12 levels today and treat based on results.      Relevant Orders   Vitamin B12   Vitamin D  deficiency   She has not taken her prescribed vitamin D  for months, and her vitamin D  levels need reassessment. A vitamin D  level test is ordered. A  prescription for vitamin D  will be sent if levels are low.      Relevant Orders   VITAMIN D  25 Hydroxy (Vit-D Deficiency, Fractures)    Return in about 3 months (around 10/20/2024) for CPE.    [name], NP

## 2024-07-20 NOTE — Assessment & Plan Note (Signed)
 She is currently not taking any supplements and does not like to give herself injections. Chcek B12 levels today and treat based on results.

## 2024-07-20 NOTE — Patient Instructions (Addendum)
 It was great to see you!  We are checking your labs today and will let you know the results via mychart/phone.   Let's see where your vitamin D  and B12 are currently.   Let's follow-up in 3 months, sooner if you have concerns.  If a referral was placed today, you will be contacted for an appointment. Please note that routine referrals can sometimes take up to 3-4 weeks to process. Please call our office if you haven't heard anything after this time frame.  Take care,  Tinnie Harada, NP

## 2024-08-10 ENCOUNTER — Telehealth: Admitting: Obstetrics and Gynecology

## 2024-08-10 ENCOUNTER — Encounter: Payer: Self-pay | Admitting: Obstetrics and Gynecology

## 2024-08-10 DIAGNOSIS — N94819 Vulvodynia, unspecified: Secondary | ICD-10-CM

## 2024-08-10 DIAGNOSIS — N926 Irregular menstruation, unspecified: Secondary | ICD-10-CM

## 2024-08-10 DIAGNOSIS — R3989 Other symptoms and signs involving the genitourinary system: Secondary | ICD-10-CM

## 2024-08-10 MED ORDER — PHENAZOPYRIDINE HCL 200 MG PO TABS
200.0000 mg | ORAL_TABLET | Freq: Three times a day (TID) | ORAL | 0 refills | Status: AC | PRN
Start: 2024-08-10 — End: ?

## 2024-08-10 NOTE — Progress Notes (Signed)
 Virtual Visit via Video Note  I connected with Norma Wright on 08/10/24 at  3:40 PM EST by a video enabled telemedicine application and verified that I am speaking with the correct person using two identifiers.  Location: Patient: in her dorm in [place] Provider: In office with door closed    I discussed the limitations of evaluation and management by telemedicine and the availability of in person appointments. The patient expressed understanding and agreed to proceed.  History of Present Illness:  Patient has a hx of IC. Patient reports she is doing well with her bladder pain and has noticed some twinges with stress but has been doing her best to manage her stress levels. She has not been using her dilators and reports she has not yet talked to OBGYN about her birth control.    Observations/Objective: Patient is alert and oriented and responding appropriately.    Assessment and Plan: Patient to have pyridium  on hand if needed for bladder irritation. She can call if rescue bladder instillation needed.    Follow Up Instructions:  Patient to follow up with her GYN to discuss between Nuvring and IUD insertion for BCP methods. 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     I discussed the assessment and treatment plan with the patient. The patient was provided an opportunity to ask questions and all were answered. The patient agreed with the plan and demonstrated an understanding of the instructions.   The patient was advised to call back or seek an in-person evaluation if the symptoms worsen or if the condition fails to improve as anticipated.  I provided 10 minutes of non-face-to-face time during this encounter.   Javonne Louissaint G Inioluwa Baris, NP

## 2024-08-20 ENCOUNTER — Other Ambulatory Visit: Payer: Self-pay

## 2024-08-20 ENCOUNTER — Ambulatory Visit
Admission: EM | Admit: 2024-08-20 | Discharge: 2024-08-20 | Disposition: A | Discharge status: Home / Self Care | Attending: Emergency Medicine | Admitting: Emergency Medicine

## 2024-08-20 DIAGNOSIS — J029 Acute pharyngitis, unspecified: Secondary | ICD-10-CM

## 2024-08-20 DIAGNOSIS — J101 Influenza due to other identified influenza virus with other respiratory manifestations: Secondary | ICD-10-CM

## 2024-08-20 LAB — POC COVID19/FLU A&B COMBO
Covid Antigen, POC: NEGATIVE
Influenza A Antigen, POC: POSITIVE — AB
Influenza B Antigen, POC: NEGATIVE

## 2024-08-20 MED ORDER — ACETAMINOPHEN 500 MG PO TABS: 500.0000 mg | ORAL_TABLET | Freq: Four times a day (QID) | ORAL | 0 refills | Status: DC | PRN

## 2024-08-20 MED ORDER — IBUPROFEN 800 MG PO TABS: 800.0000 mg | ORAL_TABLET | Freq: Three times a day (TID) | ORAL | 0 refills | Status: DC

## 2024-08-20 MED ORDER — OSELTAMIVIR PHOSPHATE 75 MG PO CAPS: 75.0000 mg | ORAL_CAPSULE | Freq: Two times a day (BID) | ORAL | 0 refills | Status: DC

## 2024-08-20 MED ORDER — BENZONATATE 100 MG PO CAPS: 100.0000 mg | ORAL_CAPSULE | Freq: Three times a day (TID) | ORAL | 0 refills | Status: DC

## 2024-08-20 NOTE — ED Triage Notes (Signed)
 Pt presents with c/o cough, sore throat, and generalized body aches. Symptoms began yesterday morning. Only endorses SOB and discomfort in chest when coughing. Rates overall pain a 10/10. Body is aching. Feels very weak. Denies fevers at home. Hx of fibromyalgia. Does voice increased stress lately due to a break up with boyfriend.

## 2024-08-20 NOTE — ED Provider Notes (Signed)
 [facility]    CSN: [id number] Arrival date & time: 08/20/24  1304      History   Chief Complaint Chief Complaint  Patient presents with   Sore Throat   Generalized Body Aches    HPI Norma Wright is a 23 y.o. female.   Patient presents to clinic over concern of cough, sore throat, generalized bodyaches, congestion, fatigue and feeling unwell that started yesterday morning.  Generalized bodyaches, feels like she is having a flareup of her fibromyalgia.  Did recently go through a stressful situation in a very, is unsure if this is may be related.  Has not tried medication throughout her follow-through prior to arrival.  Endorses generalized weakness.  Does not think she has had any fevers at home.  The history is provided by the patient and medical records.  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   Oral contraceptive use 09/16/2023   Other fatigue 09/02/2023   Left lateral epicondylitis 09/02/2023   Myofascial neck pain 08/05/2023   Interstitial cystitis 08/01/2023   Chronic pain syndrome 07/22/2023   Bilateral headaches 07/22/2023   Adjustment disorder with depressed mood 07/22/2023   Fibromyalgia 06/24/2023   Concussion 06/13/2023   Chronic female pelvic pain 05/17/2023   Dysuria 05/17/2023   Physical deconditioning 07/25/2022   Intermittent palpitations 07/18/2022   DOE (dyspnea on exertion) 07/18/2022   Dysmenorrhea 07/18/2022   Excessive hair growth 07/18/2022   Obesity (BMI  30-39.9) 07/18/2022   Hot flashes 06/27/2022   Abnormal bruising 06/27/2022   Generalized anxiety disorder 06/27/2022   Positive ANA (antinuclear antibody) 06/19/2022   Sedimentation rate elevation 06/05/2022   Polyarthralgia 06/05/2022   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 No pertinent surgical history.  OB History     Gravida  0   Para  0   Term  0   Preterm  0   AB  0   Living  0      SAB  0   IAB  0   Ectopic  0   Multiple  0   Live Births  0            Home Medications    Prior to Admission medications   Medication Sig Start Date End Date Taking? Authorizing Provider  acetaminophen (TYLENOL) 500 MG tablet Take 1 tablet (500 mg total) by mouth every 6 (six) hours as needed. 08/20/24  Yes Rakesh Dutko  N, FNP  benzonatate (TESSALON) 100 MG capsule Take 1 capsule (100 mg total) by mouth every 8 (eight) hours. 08/20/24  Yes Navayah Sok  N, FNP  ibuprofen  (ADVIL ) 800 MG tablet Take 1 tablet (800 mg total) by mouth 3 (three) times daily. 08/20/24  Yes Matthe Sloane  N, FNP  oseltamivir (TAMIFLU) 75 MG capsule Take 1 capsule (75 mg total) by mouth every 12 (twelve) hours. 08/20/24  Yes Dreama, Keshan Reha  N, FNP  amitriptyline (  ELAVIL) 25 MG tablet Take 25 mg by mouth at bedtime. 08/07/24   [provider]  ARIPiprazole (ABILIFY) 2 MG tablet Take 2 mg by mouth at bedtime. Patient not taking: Reported on 07/20/2024 05/05/24   [provider]  cloNIDine (CATAPRES) 0.1 MG tablet Take 0.1 mg by mouth at bedtime. 08/07/24   [provider]  FLUoxetine (PROZAC) 40 MG capsule Take 40 mg by mouth every morning. 02/26/24   [provider]  lidocaine -prilocaine  (EMLA ) cream Apply 1 Application topically as needed. Apply cream to skin to assist with pain 02/05/24   Zuleta, Kaitlin G, NP  Multiple Vitamin (MULTI-VITAMIN) tablet Take 1 tablet by  mouth daily.    [provider]  phenazopyridine  (PYRIDIUM ) 200 MG tablet Take 1 tablet (200 mg total) by mouth 3 (three) times daily as needed for pain. 08/10/24   Zuleta, Kaitlin G, NP  topiramate (TOPAMAX) 25 MG tablet Take 25 mg by mouth at bedtime. 07/10/24   [provider]  VITAMIN D  PO Take by mouth once a week. Patient not taking: Reported on 07/20/2024    [provider]  Vitamin D , Ergocalciferol , (DRISDOL ) 1.25 MG (50000 UNIT) CAPS capsule Take 1 capsule (50,000 Units total) by mouth every 7 (seven) days. 07/20/24   McElwee, Tinnie LABOR, NP    Family History Family History  Problem Relation Age of Onset   Heart Problems Mother    Congestive Heart Failure Mother    Asthma Sister    Kidney disease Maternal Grandfather    Asthma Maternal Uncle    Bladder Cancer Neg Hx    Uterine cancer Neg Hx    Migraines Neg Hx     Social History Social History   Tobacco Use   Smoking status: Never    Passive exposure: Never   Smokeless tobacco: Never  Vaping Use   Vaping status: Never Used  Substance Use Topics   Alcohol use: No   Drug use: Never     Allergies   Shellfish allergy    Review of Systems Review of Systems  Per HPI  Physical Exam Triage Vital Signs ED Triage Vitals  Encounter Vitals Group     BP 08/20/24 1314 109/70     Girls Systolic BP Percentile --      Girls Diastolic BP Percentile --      Boys Systolic BP Percentile --      Boys Diastolic BP Percentile --      Pulse Rate 08/20/24 1314 95     Resp 08/20/24 1314 16     Temp 08/20/24 1314 (!) 100.4 F (38 C)     Temp Source 08/20/24 1314 Oral     SpO2 08/20/24 1314 97 %     Weight 08/20/24 1313 193 lb 12.6 oz (87.9 kg)     Height 08/20/24 1313 5' 7 (1.702 m)     Head Circumference --      Peak Flow --      Pain Score 08/20/24 1313 10     Pain Loc --      Pain Education --      Exclude from Growth Chart --    No data found.  Updated Vital Signs BP 109/70 (BP  Location: Right Arm)   Pulse 95   Temp (!) 100.4 F (38 C) (Oral)   Resp 16   Ht 5' 7 (1.702 m)   Wt 193 lb 12.6 oz (87.9 kg)   LMP 07/24/2024 (Within Weeks)   SpO2 97%  BMI 30.35 kg/m   Visual Acuity Right Eye Distance:   Left Eye Distance:   Bilateral Distance:    Right Eye Near:   Left Eye Near:    Bilateral Near:     Physical Exam Vitals and nursing note reviewed.  Constitutional:      Appearance: Normal appearance.  HENT:     Head: Normocephalic and atraumatic.     Right Ear: External ear normal.     Left Ear: External ear normal.     Nose: Nose normal.     Mouth/Throat:     Mouth: Mucous membranes are moist.     Pharynx: Posterior oropharyngeal erythema present.     Tonsils: No tonsillar exudate or tonsillar abscesses.  Eyes:     Conjunctiva/sclera: Conjunctivae normal.  Cardiovascular:     Rate and Rhythm: Normal rate and regular rhythm.     Heart sounds: Normal heart sounds. No murmur heard. Pulmonary:     Effort: Pulmonary effort is normal. No respiratory distress.     Breath sounds: Normal breath sounds. No wheezing.  Neurological:     General: No focal deficit present.     Mental Status: She is alert.  Psychiatric:        Mood and Affect: Mood normal.      UC Treatments / Results  Labs (all labs ordered are listed, but only abnormal results are displayed) Labs Reviewed  POC COVID19/FLU A&B COMBO - Abnormal; Notable for the following components:      Result Value   Influenza A Antigen, POC Positive (*)    All other components within normal limits    EKG   Radiology No results found.  Procedures Procedures (including critical care time)  Medications Ordered in UC Medications - No data to display  Initial Impression / Assessment and Plan / UC Course  I have reviewed the triage vital signs and the nursing notes.  Pertinent labs & imaging results that were available during my care of the patient were reviewed by me and considered in  my medical decision making (see chart for details).  Vitals and triage reviewed, patient is hemodynamically stable.  Lungs vesicular, heart with regular rate and rhythm.  Posterior pharynx erythema.  Febrile in clinic.  POC COVID and flu testing positive for influenza A.  Within the window for Tamiflu, will send.  Symptomatic management for viral URI discussed.  Plan of care, follow-up care and return precautions given, no questions at this time.     Final Clinical Impressions(s) / UC Diagnoses   Final diagnoses:  Acute pharyngitis, unspecified etiology  Influenza A     Discharge Instructions      You have the flu, this is a viral illness.  Typical viral illnesses last 5 to 7 days in duration.  He can alternate between 800 mg of ibuprofen  and 500 mg of Tylenol every 4-6 hours to help with fever, body aches and chills.  Start taking the Tamiflu and take this twice daily until finished to help you potentially get over the flu sooner.  Return to clinic for any new or urgent symptoms or prolonged symptoms.    ED Prescriptions     Medication Sig Dispense Auth. Provider   ibuprofen  (ADVIL ) 800 MG tablet Take 1 tablet (800 mg total) by mouth 3 (three) times daily. 30 tablet Dreama, Jenai Scaletta  N, FNP   acetaminophen (TYLENOL) 500 MG tablet Take 1 tablet (500 mg total) by mouth every 6 (six) hours as needed. 30 tablet Dreama,  Miliyah Luper  N, FNP   oseltamivir (TAMIFLU) 75 MG capsule Take 1 capsule (75 mg total) by mouth every 12 (twelve) hours. 10 capsule Dreama, Jonny Longino  N, FNP   benzonatate (TESSALON) 100 MG capsule Take 1 capsule (100 mg total) by mouth every 8 (eight) hours. 21 capsule Dreama, Ceili Boshers  N, FNP      PDMP not reviewed this encounter.   Dreama, Derk Doubek  N, FNP 08/20/24 1345

## 2024-08-20 NOTE — Discharge Instructions (Addendum)
 You have the flu, this is a viral illness.  Typical viral illnesses last 5 to 7 days in duration.  He can alternate between 800 mg of ibuprofen  and 500 mg of Tylenol every 4-6 hours to help with fever, body aches and chills.  Start taking the Tamiflu and take this twice daily until finished to help you potentially get over the flu sooner.  Return to clinic for any new or urgent symptoms or prolonged symptoms.

## 2024-08-23 ENCOUNTER — Telehealth

## 2024-08-25 ENCOUNTER — Ambulatory Visit: Admission: EM | Admit: 2024-08-25 | Discharge: 2024-08-25 | Disposition: A | Discharge status: Home / Self Care

## 2024-08-25 ENCOUNTER — Ambulatory Visit: Admitting: Family Medicine

## 2024-08-25 VITALS — BP 100/65 | HR 73 | Temp 98.3°F | Ht 67.0 in | Wt 196.0 lb

## 2024-08-25 DIAGNOSIS — F322 Major depressive disorder, single episode, severe without psychotic features: Secondary | ICD-10-CM

## 2024-08-25 DIAGNOSIS — J111 Influenza due to unidentified influenza virus with other respiratory manifestations: Secondary | ICD-10-CM

## 2024-08-25 DIAGNOSIS — R45851 Suicidal ideations: Secondary | ICD-10-CM

## 2024-08-25 MED ORDER — ALBUTEROL SULFATE HFA 108 (90 BASE) MCG/ACT IN AERS: 2.0000 | INHALATION_SPRAY | Freq: Four times a day (QID) | RESPIRATORY_TRACT | 0 refills | Status: DC | PRN

## 2024-08-25 MED ORDER — HYDROCODONE BIT-HOMATROP MBR 5-1.5 MG/5ML PO SOLN: 5.0000 mL | Freq: Four times a day (QID) | ORAL | 0 refills | Status: DC | PRN

## 2024-08-25 MED ORDER — ARIPIPRAZOLE 5 MG PO TABS: 5.0000 mg | ORAL_TABLET | Freq: Every day | ORAL | 0 refills | Status: AC

## 2024-08-25 NOTE — Progress Notes (Signed)
 Assessment & Plan   Assessment/Plan:    Assessment & Plan Influenza with persistent respiratory symptoms Diagnosed with influenza five days ago, confirmed by antigen testing. Completed a course of Tamiflu . Currently experiencing persistent cough, congestion, wheezing, and chest pain, particularly at night. No fever, but reports night sweats and alternating temperatures. Symptoms suggest post-viral recovery phase with residual inflammation and mucus drainage. No significant shortness of breath during the day. Costochondritis likely due to persistent coughing. - Prescribed promethazine DM for cough management. - Prescribed hydrocodone  cough syrup 5 mL every 6 hours as needed for cough. - Prescribed albuterol  inhaler, 2 puffs every 6 hours as needed for cough, shortness of breath, or wheezing. - Continue ibuprofen  and acetaminophen  as needed for pain and fever. - Encouraged hydration and rest. - Advised on the use of guaifenesin (Mucinex) for mucus relief. - Discussed potential side effects of medications, including dizziness and dryness. - Encouraged light physical activity as tolerated.  Major depressive disorder with suicidal ideation Reports feeling better off dead but denies any active plans or intent to harm herself. No thoughts of harming others. Expresses concern about the impact of her actions on her mother and upcoming holidays. Currently under psychiatric care with a therapist and psychiatrist. Scheduled to see psychiatrist next Monday and therapist on Thursday. Discussed the importance of safety and the availability of psychiatric urgent care resources. - Increased aripiprazole  to 5 mg at night. - Continue current psychiatric medications: amitriptyline, fluoxetine, and clonidine. - Provided information on suicide resources and psychiatric urgent care. - Encouraged follow-up with psychiatrist and therapist. - Discussed the importance of safety and reaching out for help if  needed.        Medications Discontinued During This Encounter  Medication Reason   ARIPiprazole  (ABILIFY ) 5 MG tablet Reorder    Return if symptoms worsen or fail to improve.        Subjective:   Encounter date: 08/25/2024  Norma Wright is a 23 y.o. female who has Cystic acne; Irritable bowel syndrome with constipation; LLQ pain; Sedimentation rate elevation; Polyarthralgia; Positive ANA (antinuclear antibody); Hot flashes; Abnormal bruising; Generalized anxiety disorder; Intermittent palpitations; DOE (dyspnea on exertion); Dysmenorrhea; Excessive hair growth; Obesity (BMI 30-39.9); Physical deconditioning; Chronic female pelvic pain; Dysuria; Concussion; Fibromyalgia; Chronic pain syndrome; Bilateral headaches; Adjustment disorder with depressed mood; Interstitial cystitis; Myofascial neck pain; Other fatigue; Left lateral epicondylitis; Oral contraceptive use; Vulvodynia; Primary insomnia; Vitamin B12 deficiency; Vitamin D  deficiency; Iron deficiency; Postural dizziness with presyncope; Hemorrhoids; H/O concussion; and Grade I internal hemorrhoids on their problem list..   She  has a past medical history of Anemia, Fibromyalgia, Heart murmur, Interstitial cystitis, and PID (pelvic inflammatory disease)..   She presents with chief complaint of Influenza (Pt was diagnosed with flu last week, finished tamaflu & now she feels like she can not get the mucus out of her chest ) .   Discussed the use of AI scribe software for clinical note transcription with the patient, who gave verbal consent to proceed.  History of Present Illness Norma Wright is a 23 year old female who presents with ongoing flu-like symptoms, cough, and congestion.  Influenza symptoms and disease course - Diagnosed with influenza five days ago via positive antigen testing. - Completed oseltamivir  35 mg BID for five days. - Persistent flu-like symptoms despite antiviral therapy. - Initial fever resolved;  currently afebrile. - Night sweats with alternating sensations of feeling cold and extremely hot, waking up soaked in sweat. - Difficulty clearing congestion despite supportive measures  such as tea and soup. - Drinks fluids, including smoothies, to maintain hydration.  Respiratory symptoms - Lingering cough. - Congestion with difficulty clearing mucus. - Wheezing and chest pain, especially when coughing and late at night.  Headache and nasal dryness - Headaches attributed to nasal dryness.  Symptom management - Uses benzonatate  for cough; initially effective but now less so, with six pills remaining. - Uses ibuprofen  as needed for pain and headaches.  Psychiatric symptoms - History of psychiatric care; currently sees a therapist weekly. - Upcoming appointment with psychiatrist. - Expresses feelings of being 'better off dead' but denies any plans to harm herself.      08/25/2024    4:19 PM 03/23/2024   11:09 AM 12/13/2023   10:02 AM 12/13/2023    9:27 AM 08/05/2023    1:50 PM  Depression screen PHQ 2/9  Decreased Interest 3 0 0 0 0  Down, Depressed, Hopeless 3 0 0 0 0  PHQ - 2 Score 6 0 0 0 0  Altered sleeping 2  2    Tired, decreased energy 3  3    Change in appetite 2  0    Feeling bad or failure about yourself  3  0    Trouble concentrating 3  2    Moving slowly or fidgety/restless 2  0    Suicidal thoughts 3  0    PHQ-9 Score 24  7     Difficult doing work/chores Extremely dIfficult  Very difficult       Data saved with a previous flowsheet row definition      08/25/2024    4:20 PM 12/13/2023   10:02 AM 06/13/2023    3:05 PM  GAD 7 : Generalized Anxiety Score  Nervous, Anxious, on Edge 3 3 2   Control/stop worrying 3 3 1   Worry too much - different things 3 3 1   Trouble relaxing 3 1 0  Restless 0 0 0  Easily annoyed or irritable 3 1 0  Afraid - awful might happen 2 2 0  Total GAD 7 Score 17 13 4   Anxiety Difficulty Very difficult Extremely difficult Somewhat  difficult      ROS  No past surgical history on file.  Outpatient Medications Prior to Visit  Medication Sig Dispense Refill   acetaminophen  (TYLENOL ) 500 MG tablet Take 1 tablet (500 mg total) by mouth every 6 (six) hours as needed. 30 tablet 0   amitriptyline (ELAVIL) 25 MG tablet Take 25 mg by mouth at bedtime.     benzonatate  (TESSALON ) 100 MG capsule Take 1 capsule (100 mg total) by mouth every 8 (eight) hours. 21 capsule 0   cloNIDine (CATAPRES) 0.1 MG tablet Take 0.1 mg by mouth at bedtime.     FLUoxetine (PROZAC) 40 MG capsule Take 40 mg by mouth every morning.     ibuprofen  (ADVIL ) 800 MG tablet Take 1 tablet (800 mg total) by mouth 3 (three) times daily. 30 tablet 0   lidocaine -prilocaine  (EMLA ) cream Apply 1 Application topically as needed. Apply cream to skin to assist with pain 30 g 4   Multiple Vitamin (MULTI-VITAMIN) tablet Take 1 tablet by mouth daily.     oseltamivir  (TAMIFLU ) 75 MG capsule Take 1 capsule (75 mg total) by mouth every 12 (twelve) hours. 10 capsule 0   phenazopyridine  (PYRIDIUM ) 200 MG tablet Take 1 tablet (200 mg total) by mouth 3 (three) times daily as needed for pain. 30 tablet 0   topiramate (TOPAMAX) 25 MG tablet  Take 25 mg by mouth at bedtime.     VITAMIN D  PO Take by mouth once a week.     Vitamin D , Ergocalciferol , (DRISDOL ) 1.25 MG (50000 UNIT) CAPS capsule Take 1 capsule (50,000 Units total) by mouth every 7 (seven) days. 4 capsule 0   ARIPiprazole  (ABILIFY ) 5 MG tablet Take 2 mg by mouth at bedtime.     No facility-administered medications prior to visit.    Family History  Problem Relation Age of Onset   Heart Problems Mother    Congestive Heart Failure Mother    Asthma Sister    Kidney disease Maternal Grandfather    Asthma Maternal Uncle    Bladder Cancer Neg Hx    Uterine cancer Neg Hx    Migraines Neg Hx     Social History   Socioeconomic History   Marital status: Single    Spouse name: Not on file   Number of children: Not  on file   Years of education: Not on file   Highest education level: Some college, no degree  Occupational History   Not on file  Tobacco Use   Smoking status: Never    Passive exposure: Never   Smokeless tobacco: Never  Vaping Use   Vaping status: Never Used  Substance and Sexual Activity   Alcohol use: No   Drug use: Never   Sexual activity: Never    Partners: Male    Comment: menarche 14yo, never sexually active  Other Topics Concern   Not on file  Social History Narrative   Lives with her mother, will be moving to a dorm next month    Right handed   Caffeine: diet coke maybe 2 times per week, occasional coffee but tries to avoid    Social Drivers of Corporate Investment Banker Strain: Low Risk  (07/16/2024)   Overall Financial Resource Strain (CARDIA)    Difficulty of Paying Living Expenses: Not very hard  Food Insecurity: No Food Insecurity (07/16/2024)   Hunger Vital Sign    Worried About Running Out of Food in the Last Year: Never true    Ran Out of Food in the Last Year: Never true  Transportation Needs: No Transportation Needs (07/16/2024)   PRAPARE - Administrator, Civil Service (Medical): No    Lack of Transportation (Non-Medical): No  Physical Activity: Unknown (07/16/2024)   Exercise Vital Sign    Days of Exercise per Week: 5 days    Minutes of Exercise per Session: Patient declined  Stress: Stress Concern Present (07/16/2024)   [employer] of Occupational Health - Occupational Stress Questionnaire    Feeling of Stress: To some extent  Social Connections: Socially Isolated (07/16/2024)   Social Connection and Isolation Panel    Frequency of Communication with Friends and Family: More than three times a week    Frequency of Social Gatherings with Friends and Family: Once a week    Attends Religious Services: Never    Database Administrator or Organizations: No    Attends Engineer, Structural: Not on file    Marital Status:  Never married  Intimate Partner Violence: Not on file  Objective:  Physical Exam: BP 100/65   Pulse 73   Temp 98.3 F (36.8 C)   Ht 5' 7 (1.702 m)   Wt 196 lb (88.9 kg)   LMP 07/24/2024 (Within Weeks)   SpO2 98%   BMI 30.70 kg/m    Physical Exam GENERAL: Alert, cooperative, well developed, no acute distress. HEENT: Normocephalic, normal oropharynx, moist mucous membranes. CHEST: Clear to auscultation bilaterally, no wheezes, rhonchi, or crackles. Mild tenderness at the costochondral junction. CARDIOVASCULAR: Normal heart rate and rhythm, S1 and S2 normal without murmurs. ABDOMEN: Soft, non-tender, non-distended, without organomegaly, normal bowel sounds. EXTREMITIES: No cyanosis or edema. NEUROLOGICAL: Cranial nerves grossly intact, moves all extremities without gross motor or sensory deficit.   Physical Exam  Exercise Tolerance Test Result Date: 06/16/2024   Exercise capacity was severely impaired. Patient exercised for 2 min and 0 sec. Maximum HR of 139 bpm. MPHR 69.0%. Peak METS 4.6.   No ST deviation was noted. The ECG was not diagnostic due to failure to achieve 85% MAPHR.   Prior study not available for comparison. Nondiagnostic due to failure to achieve target heart rate. Poor exercise tolerance. Given low METs, cannot definitively determine chronotropic incompetence, as patient was able to increase heart rate to 139 bpm (70% APMHR) despite low METs.    Recent Results (from the past 2160 hours)  Exercise Tolerance Test     Status: None   Collection Time: 06/16/24  3:46 PM  Result Value Ref Range   Angina Index 0    Rest HR 74.0 bpm   Rest BP 107/65 mmHg   Exercise duration (min) 2 min   Exercise duration (sec) 0 sec   Estimated workload 4.6    Peak HR 139 bpm   Peak BP 103/63 mmHg   MPHR 199 bpm   Percent HR 69.0 %   D[facility] Treadmill Score 2    ST Depression (mm) 0  mm  Iron, TIBC and Ferritin Panel     Status: None   Collection Time: 07/09/24 10:13 AM  Result Value Ref Range   Total Iron Binding Capacity 273 250 - 450 ug/dL   UIBC 829 868 - 574 ug/dL   Iron 896 27 - 840 ug/dL   Iron Saturation 38 15 - 55 %   Ferritin 32 15 - 150 ng/mL  VITAMIN D  25 Hydroxy (Vit-D Deficiency, Fractures)     Status: Abnormal   Collection Time: 07/20/24  2:54 PM  Result Value Ref Range   VITD 23.47 (L) 30.00 - 100.00 ng/mL  Vitamin B12     Status: None   Collection Time: 07/20/24  2:54 PM  Result Value Ref Range   Vitamin B-12 281 211 - 911 pg/mL  POC Covid19/Flu A&B Antigen     Status: Abnormal   Collection Time: 08/20/24  1:19 PM  Result Value Ref Range   Influenza A Antigen, POC Positive (A) Negative   Influenza B Antigen, POC Negative Negative   Covid Antigen, POC Negative Negative        Beverley Adine Hummer, MD, MS

## 2024-08-25 NOTE — ED Triage Notes (Signed)
 She has been having mid upper chest pain with breathing/coughing, headache, constant dizziness, sweats, cough, rattling sound when she breathings, and it sounds like something is in her lungs. The chest pains gets worse at night.   She has been taking her prescribed medications and Ibuprofen .

## 2024-08-25 NOTE — Patient Instructions (Signed)
 It was very nice to see you today!  VISIT SUMMARY: You visited today due to ongoing flu-like symptoms, including cough, congestion, and night sweats, despite completing a course of antiviral medication. We also discussed your feelings of depression and your current psychiatric care.  YOUR PLAN: INFLUENZA WITH PERSISTENT RESPIRATORY SYMPTOMS: You have ongoing symptoms from the flu, including cough, congestion, wheezing, and chest pain, even after completing antiviral treatment. -Take promethazine DM as prescribed for cough management. -Use hydrocodone  cough syrup, 5 mL every 6 hours as needed for cough. -Use the albuterol  inhaler, 2 puffs every 6 hours as needed for cough, shortness of breath, or wheezing. -Continue taking ibuprofen  and acetaminophen  as needed for pain and fever. -Stay hydrated and get plenty of rest. -Use guaifenesin (Mucinex) to help relieve mucus. -Be aware of potential side effects of medications, such as dizziness and dryness. -Engage in light physical activity as tolerated.  MAJOR DEPRESSIVE DISORDER WITH SUICIDAL IDEATION: You reported feeling better off dead but have no active plans to harm yourself. You are currently seeing a therapist and have an upcoming appointment with your psychiatrist. -Increase aripiprazole  to 5 mg at night. -Continue taking your current psychiatric medications: amitriptyline, fluoxetine, and clonidine. -Reach out to suicide resources and psychiatric urgent care if needed. -Follow up with your psychiatrist next Monday and therapist on Thursday. -Prioritize your safety and reach out for help if needed.  No follow-ups on file.   Take care, Arvella Hummer, MD, MS   PLEASE NOTE:  If you had any lab tests, please let us  know if you have not heard back within a few days. You may see your results on mychart before we have a chance to review them but we will give you a call once they are reviewed by us .   If we ordered any referrals today,  please let us  know if you have not heard from their office within the next week.   If you had any urgent prescriptions sent in today, please check with the pharmacy within an hour of our visit to make sure the prescription was transmitted appropriately.   Please try these tips to maintain a healthy lifestyle:  Eat at least 3 REAL meals and 1-2 snacks per day.  Aim for no more than 5 hours between eating.  If you eat breakfast, please do so within one hour of getting up.   Each meal should contain half fruits/vegetables, one quarter protein, and one quarter carbs (no bigger than a computer mouse)  Cut down on sweet beverages. This includes juice, soda, and sweet tea.   Drink at least 1 glass of water with each meal and aim for at least 8 glasses per day  Exercise at least 150 minutes every week.    For urgent psychiatric assistance you can go to   [facility] Phone: [phone]  [address] [place], [place] [address]  Hours: Open 24/7, No appointment required.  [facility]  [address] [place], [place] [address] Phone: [phone]  When you're going through tough times, it's easy to feel lonely and overwhelmed. Remember that YOU ARE NOT ALONE and we at [facility] want to support you during this difficult time.  There are many ways to get help and support when you are ready.  You can call the following help lines: - National suicide hotline (818-729-9349) - National Hopeline Network (1-800-SUICIDE)   You can schedule an appointment with a team member with your primary care doc  or one of our counselors.  We are all here to support you.  A doctor is on call 24/7   There are many treatments that can help people during difficult times, and we can talk with you about medications, counseling, diet, and other options. We want to offer you HOPE that it won't always feel this bad.   If you are seriously thinking about hurting yourself or  have a plan, please call 911 or go to any emergency room right away for immediate help.

## 2024-08-31 ENCOUNTER — Ambulatory Visit: Admitting: Emergency Medicine

## 2024-09-15 ENCOUNTER — Other Ambulatory Visit (HOSPITAL_COMMUNITY)
Admission: RE | Admit: 2024-09-15 | Discharge: 2024-09-15 | Disposition: A | Discharge status: Home / Self Care | Source: Ambulatory Visit | Attending: Obstetrics and Gynecology | Admitting: Obstetrics and Gynecology

## 2024-09-15 ENCOUNTER — Encounter: Payer: Self-pay | Admitting: Obstetrics and Gynecology

## 2024-09-15 ENCOUNTER — Ambulatory Visit: Admitting: Obstetrics and Gynecology

## 2024-09-15 VITALS — BP 105/70 | HR 82

## 2024-09-15 DIAGNOSIS — N898 Other specified noninflammatory disorders of vagina: Secondary | ICD-10-CM

## 2024-09-15 DIAGNOSIS — M6289 Other specified disorders of muscle: Secondary | ICD-10-CM | POA: Diagnosis not present

## 2024-09-15 MED ORDER — CYCLOBENZAPRINE HCL 5 MG PO TABS: 5.0000 mg | ORAL_TABLET | Freq: Three times a day (TID) | ORAL | 1 refills | Status: AC | PRN

## 2024-09-15 NOTE — Progress Notes (Signed)
 [facility] Return Visit  SUBJECTIVE  History of Present Illness: Norma Wright is a 23 y.o. female seen for vaginal bleeding and pain with intercourse. Patient reports she has intercourse for the first time and had bleeding. She states she then had intercourse again and also had bleeding. She is concerned if this is related to IC or if the blood is coming from her bladder.   Patient is not currently on birth control, so she and her partner had been using condoms. She reports that intercourse lasted over an hour and her partner was receptive to stopping when she was uncomfortable.   She reports her partner feels a ridge at the vaginal entrance which is uncomfortable for her. She has not achieved orgasm during penetrative intercourse.     Past Medical History: Patient  has a past medical history of Anemia, Fibromyalgia, Heart murmur, Interstitial cystitis, and PID (pelvic inflammatory disease).   Past Surgical History: She  has no past surgical history on file.   Medications: She has a current medication list which includes the following prescription(s): amitriptyline, aripiprazole , cyclobenzaprine , fluoxetine, multi-vitamin, lidocaine -prilocaine , phenazopyridine , topiramate, and vitamin d  (ergocalciferol ).   Allergies: Patient is allergic to shellfish allergy .   Social History: Patient  reports that she has never smoked. She has never been exposed to tobacco smoke. She has never used smokeless tobacco. She reports that she does not drink alcohol and does not use drugs.     OBJECTIVE     Physical Exam: Vitals:   09/15/24 0933  BP: 105/70  Pulse: 82   Gen: No apparent distress, A&O x 3.  Detailed Urogynecologic Evaluation:  External vaginal exam normal. Urethra normal in appearance. Speculum exam reveals bruising to the right vaginal wall with a green discharge noted in the vagina. Cervix is non-friable and does not appear tender. There is no active bleeding  noted in the vagina.       ASSESSMENT AND PLAN    Ms. Norma Wright is a 23 y.o. with:  1. Pelvic floor dysfunction in female   2. Vaginal irritation    We discussed that her pelvic floor muscles are very tense, especially the levator muscles. We discussed that psychologically if she starts to associate pain with intercourse she may have a harder time enjoying the intimacy. She has dilators at home she can use to work on massaging the levator muscles. We also discussed changing to a non-latex condom and using some lubrication at her opening that has more glide and won't dry out as easily. Samples of uberlube given. Patient can also take Flexeril  prior to intercourse to assist her pelvic floor in relaxing.  We discussed that she can also work on mental preparation for penetrative intercourse by focusing on her orgasm prior to penetrative intercourse to relax the pelvic floor muscles. She also has options of different positions that may give her more control of the depth and rate of penetration. Swab of the vaginal fluid done today to rule out yeast and BV as potential irritants of the vaginal tissues.  Patient to follow up with her OBGYN regarding starting some form of birth control.     Norma Rumpf G Conception Doebler, NP

## 2024-09-15 NOTE — Patient Instructions (Addendum)
 Please try non-latex condoms if you can.   Please do not have unprotected sex as you are not yet on birth control.   Use a small amount of the uberlube into the vagina and around the opening prior to intercourse.   Consider using the dilators at the opening of the vagina and the muscles on the sides as a pelvic floor massage.   Please use the flexeril  prior to intercourse to help relax your pelvic floor muscles.

## 2024-09-16 LAB — CERVICOVAGINAL ANCILLARY ONLY
Bacterial Vaginitis (gardnerella): NEGATIVE
Candida Glabrata: NEGATIVE
Candida Vaginitis: NEGATIVE
Chlamydia: NEGATIVE
Comment: NEGATIVE
Comment: NEGATIVE
Comment: NEGATIVE
Comment: NEGATIVE
Comment: NEGATIVE
Comment: NORMAL
Neisseria Gonorrhea: NEGATIVE
Trichomonas: NEGATIVE

## 2024-09-18 ENCOUNTER — Ambulatory Visit: Payer: Self-pay | Admitting: Obstetrics and Gynecology

## 2024-09-20 ENCOUNTER — Encounter: Payer: Self-pay | Admitting: Nurse Practitioner

## 2024-09-23 ENCOUNTER — Encounter: Payer: Self-pay | Admitting: Physical Medicine and Rehabilitation

## 2024-09-23 ENCOUNTER — Ambulatory Visit

## 2024-09-23 ENCOUNTER — Encounter: Admitting: Physical Medicine and Rehabilitation

## 2024-09-23 VITALS — Temp 98.6°F

## 2024-09-23 VITALS — BP 109/75 | HR 76 | Ht 67.0 in | Wt 200.8 lb

## 2024-09-23 DIAGNOSIS — Z23 Encounter for immunization: Secondary | ICD-10-CM

## 2024-09-23 DIAGNOSIS — Z111 Encounter for screening for respiratory tuberculosis: Secondary | ICD-10-CM

## 2024-09-23 NOTE — Progress Notes (Signed)
 PPD Placement note Norma Wright, 24 y.o. female is here today for placement of PPD test Reason for PPD test: Work Pt taken PPD test before: no Verified in allergy  area and with patient that they are not allergic to the products PPD is made of (Phenol or Tween). Yes Is patient taking any oral or IV steroid medication now or have they taken it in the last month? no Has the patient ever received the BCG vaccine?: no Has the patient been in recent contact with anyone known or suspected of having active TB disease?: no      Date of exposure (if applicable): N/A      Name of person they were exposed to (if applicable): N/A Patient's Country of origin?: N/A O: Alert and oriented in NAD. P:  PPD placed on 09/23/2024.  Patient advised to return for reading within 48-72 hours.    Placement: Right forearm and patient tolerated injection well

## 2024-09-23 NOTE — Progress Notes (Signed)
 "  Subjective:    Patient ID: Norma Wright, female    DOB: 03-27-2001, 24 y.o.   MRN: [id number]  HPI   Pain Inventory Average Pain 6 Pain Right Now 4 My pain is burning, dull, and aching  In the last 24 hours, has pain interfered with the following? General activity 4 Relation with others 5 Enjoyment of life 4 What TIME of day is your pain at its worst? morning  and evening Sleep (in general) NA  Pain is worse with: bending, inactivity, and standing Pain improves with: rest, heat/ice, and medication Relief from Meds: 7  Family History  Problem Relation Age of Onset   Heart Problems Mother    Congestive Heart Failure Mother    Asthma Sister    Kidney disease Maternal Grandfather    Asthma Maternal Uncle    Bladder Cancer Neg Hx    Uterine cancer Neg Hx    Migraines Neg Hx    Social History   Socioeconomic History   Marital status: Single    Spouse name: Not on file   Number of children: Not on file   Years of education: Not on file   Highest education level: Some college, no degree  Occupational History   Not on file  Tobacco Use   Smoking status: Never    Passive exposure: Never   Smokeless tobacco: Never  Vaping Use   Vaping status: Never Used  Substance and Sexual Activity   Alcohol use: No   Drug use: Never   Sexual activity: Never    Partners: Male    Comment: menarche 14yo, never sexually active  Other Topics Concern   Not on file  Social History Narrative   Lives with her mother, will be moving to a dorm next month    Right handed   Caffeine: diet coke maybe 2 times per week, occasional coffee but tries to avoid    Social Drivers of Health   Tobacco Use: Low Risk (09/23/2024)   Patient History    Smoking Tobacco Use: Never    Smokeless Tobacco Use: Never    Passive Exposure: Never  Financial Resource Strain: Low Risk (07/16/2024)   Overall Financial Resource Strain (CARDIA)    Difficulty of Paying Living Expenses: Not very hard  Food  Insecurity: No Food Insecurity (07/16/2024)   Epic    Worried About Programme Researcher, Broadcasting/film/video in the Last Year: Never true    Ran Out of Food in the Last Year: Never true  Transportation Needs: No Transportation Needs (07/16/2024)   Epic    Lack of Transportation (Medical): No    Lack of Transportation (Non-Medical): No  Physical Activity: Unknown (07/16/2024)   Exercise Vital Sign    Days of Exercise per Week: 5 days    Minutes of Exercise per Session: Patient declined  Stress: Stress Concern Present (07/16/2024)   [employer] of Occupational Health - Occupational Stress Questionnaire    Feeling of Stress: To some extent  Social Connections: Socially Isolated (07/16/2024)   Social Connection and Isolation Panel    Frequency of Communication with Friends and Family: More than three times a week    Frequency of Social Gatherings with Friends and Family: Once a week    Attends Religious Services: Never    Database Administrator or Organizations: No    Attends Banker Meetings: Not on file    Marital Status: Never married  Depression (PHQ2-9): Low Risk (09/23/2024)   Depression (PHQ2-9)  PHQ-2 Score: 0  Recent Concern: Depression (PHQ2-9) - High Risk (08/25/2024)   Depression (PHQ2-9)    PHQ-2 Score: 24  Alcohol Screen: Not on file  Housing: Low Risk (07/16/2024)   Epic    Unable to Pay for Housing in the Last Year: No    Number of Times Moved in the Last Year: 1    Homeless in the Last Year: No  Utilities: Not At Risk (10/30/2023)   Received from [facility]ities    Threatened with loss of utilities: No  Health Literacy: Not on file   No past surgical history on file. No past surgical history on file. Past Medical History:  Diagnosis Date   Anemia    Fibromyalgia    Heart murmur    Interstitial cystitis    PID (pelvic inflammatory disease)    BP 109/75   Pulse 76   Ht 5' 7 (1.702 m)   Wt 200 lb 12.8 oz (91.1 kg)   SpO2 100%    BMI 31.45 kg/m   Opioid Risk Score:   Fall Risk Score:  `1  Depression screen [facility] 2/9     09/23/2024    3:43 PM 08/25/2024    4:19 PM 03/23/2024   11:09 AM 12/13/2023   10:02 AM 12/13/2023    9:27 AM 08/05/2023    1:50 PM 07/22/2023   10:35 AM  Depression screen PHQ 2/9  Decreased Interest 0 3 0 0 0 0 3  Down, Depressed, Hopeless 0 3 0 0 0 0 2  PHQ - 2 Score 0 6 0 0 0 0 5  Altered sleeping  2  2   3   Tired, decreased energy  3  3   3   Change in appetite  2  0   3  Feeling bad or failure about yourself   3  0   0  Trouble concentrating  3  2   2   Moving slowly or fidgety/restless  2  0   0  Suicidal thoughts  3  0   0  PHQ-9 Score  24  7    16    Difficult doing work/chores  Extremely dIfficult  Very difficult        Data saved with a previous flowsheet row definition    Review of Systems  Musculoskeletal:  Positive for back pain.  All other systems reviewed and are negative.      Objective:   Physical Exam        Assessment & Plan:    "

## 2024-09-25 ENCOUNTER — Ambulatory Visit: Admitting: Family

## 2024-09-25 ENCOUNTER — Encounter: Payer: Self-pay | Admitting: Family

## 2024-09-25 ENCOUNTER — Ambulatory Visit

## 2024-09-25 VITALS — BP 120/64 | HR 80 | Ht 67.0 in | Wt 198.6 lb

## 2024-09-25 DIAGNOSIS — Z23 Encounter for immunization: Secondary | ICD-10-CM | POA: Diagnosis not present

## 2024-09-25 DIAGNOSIS — Z111 Encounter for screening for respiratory tuberculosis: Secondary | ICD-10-CM

## 2024-09-25 DIAGNOSIS — Z0000 Encounter for general adult medical examination without abnormal findings: Secondary | ICD-10-CM | POA: Diagnosis not present

## 2024-09-25 LAB — COMPREHENSIVE METABOLIC PANEL WITH GFR
ALT: 20 U/L (ref 3–35)
AST: 21 U/L (ref 5–37)
Albumin: 4.3 g/dL (ref 3.5–5.2)
Alkaline Phosphatase: 46 U/L (ref 39–117)
BUN: 9 mg/dL (ref 6–23)
CO2: 28 meq/L (ref 19–32)
Calcium: 9.6 mg/dL (ref 8.4–10.5)
Chloride: 103 meq/L (ref 96–112)
Creatinine, Ser: 0.81 mg/dL (ref 0.40–1.20)
GFR: 103.84 mL/min
Glucose, Bld: 56 mg/dL — ABNORMAL LOW (ref 70–99)
Potassium: 4.1 meq/L (ref 3.5–5.1)
Sodium: 139 meq/L (ref 135–145)
Total Bilirubin: 0.3 mg/dL (ref 0.2–1.2)
Total Protein: 7.2 g/dL (ref 6.0–8.3)

## 2024-09-25 LAB — CBC WITH DIFFERENTIAL/PLATELET
Basophils Absolute: 0 K/uL (ref 0.0–0.1)
Basophils Relative: 0.9 % (ref 0.0–3.0)
Eosinophils Absolute: 0.1 K/uL (ref 0.0–0.7)
Eosinophils Relative: 2.5 % (ref 0.0–5.0)
HCT: 38 % (ref 36.0–46.0)
Hemoglobin: 12.6 g/dL (ref 12.0–15.0)
Lymphocytes Relative: 28.2 % (ref 12.0–46.0)
Lymphs Abs: 1.2 K/uL (ref 0.7–4.0)
MCHC: 33 g/dL (ref 30.0–36.0)
MCV: 91.9 fl (ref 78.0–100.0)
Monocytes Absolute: 0.4 K/uL (ref 0.1–1.0)
Monocytes Relative: 9.4 % (ref 3.0–12.0)
Neutro Abs: 2.5 K/uL (ref 1.4–7.7)
Neutrophils Relative %: 59 % (ref 43.0–77.0)
Platelets: 285 K/uL (ref 150.0–400.0)
RBC: 4.14 Mil/uL (ref 3.87–5.11)
RDW: 14.2 % (ref 11.5–15.5)
WBC: 4.3 K/uL (ref 4.0–10.5)

## 2024-09-25 LAB — LIPID PANEL
Cholesterol: 213 mg/dL — ABNORMAL HIGH (ref 28–200)
HDL: 78.3 mg/dL
LDL Cholesterol: 115 mg/dL — ABNORMAL HIGH (ref 10–99)
NonHDL: 134.69
Total CHOL/HDL Ratio: 3
Triglycerides: 96 mg/dL (ref 10.0–149.0)
VLDL: 19.2 mg/dL (ref 0.0–40.0)

## 2024-09-25 LAB — TSH: TSH: 1.62 u[IU]/mL (ref 0.35–5.50)

## 2024-09-25 NOTE — Patient Instructions (Signed)

## 2024-09-25 NOTE — Progress Notes (Signed)
 "  Complete physical exam  Patient: Norma Wright   DOB: [birth year]   23 y.o. Female  MRN: [id number]  Subjective:    Chief Complaint  Patient presents with   Annual Exam    NOT FASTING     Savannha Welle is a 24 y.o. female who presents today for a complete physical exam. She reports consuming a general diet. The patient does not participate in regular exercise at present. She generally feels well. She reports sleeping fairly well. She does not have additional problems to discuss today.    Most recent fall risk assessment:    09/25/2024    9:50 AM  Fall Risk   Falls in the past year? 0  Number falls in past yr: 0  Injury with Fall? 0  Risk for fall due to : No Fall Risks  Follow up Falls evaluation completed     Most recent depression screenings:    09/25/2024    9:50 AM 09/23/2024    3:43 PM  PHQ 2/9 Scores  PHQ - 2 Score 2 0  PHQ- 9 Score 10         Patient Care Team: Nedra Tinnie LABOR, NP as PCP - General (Internal Medicine) [name], Arun K, MD as PCP - Cardiology (Cardiology)   Show/hide medication list[1]  Review of Systems  All other systems reviewed and are negative.         Objective:     BP 120/64 (BP Location: Left Arm, Patient Position: Sitting, Cuff Size: Normal)   Pulse 80   Ht 5' 7 (1.702 m)   Wt 198 lb 9.6 oz (90.1 kg)   LMP 09/17/2024   SpO2 97%   BMI 31.11 kg/m    Physical Exam Vitals and nursing note reviewed.  Constitutional:      Appearance: Normal appearance. She is normal weight.  HENT:     Head: Normocephalic and atraumatic.     Right Ear: Tympanic membrane, ear canal and external ear normal.     Left Ear: Tympanic membrane, ear canal and external ear normal.     Nose: Nose normal.     Mouth/Throat:     Mouth: Mucous membranes are moist.     Pharynx: Oropharynx is clear.  Eyes:     Extraocular Movements: Extraocular movements intact.     Conjunctiva/sclera: Conjunctivae normal.     Pupils: Pupils are equal, round,  and reactive to light.  Cardiovascular:     Rate and Rhythm: Normal rate and regular rhythm.     Pulses: Normal pulses.     Heart sounds: Normal heart sounds.  Pulmonary:     Effort: Pulmonary effort is normal.     Breath sounds: Normal breath sounds.  Abdominal:     General: Abdomen is flat. Bowel sounds are normal.     Palpations: Abdomen is soft.  Musculoskeletal:        General: Normal range of motion.     Cervical back: Normal range of motion and neck supple.  Skin:    General: Skin is warm and dry.  Neurological:     General: No focal deficit present.     Mental Status: She is alert and oriented to person, place, and time. Mental status is at baseline.  Psychiatric:        Mood and Affect: Mood normal.        Behavior: Behavior normal.        Thought Content: Thought content normal.  Judgment: Judgment normal.     No results found for any visits on 09/25/24.     Assessment & Plan:    Routine Health Maintenance and Physical Exam  Immunization History  Administered Date(s) Administered   DTaP 01/06/2004, 02/17/2004, 08/02/2004, 11/14/2004, 03/13/2008   HIB (PRP-OMP) 06/04/2005   Hepatitis A, Ped/Adol-2 Dose 11/14/2004, 07/18/2005   Hepatitis B, PED/ADOLESCENT 01/06/2004, 04/18/2004   IPV 01/06/2004, 02/17/2004, 06/04/2005, 05/13/2008   MMR 08/02/2004, 05/13/2008   Meningococcal Mcv4o 03/04/2024   PPD Test 09/23/2024   Pneumococcal Conjugate PCV 7 01/06/2004, 02/17/2004, 11/14/2004, 06/04/2005   Td 09/17/2016   Tdap 03/04/2024   Varicella 08/02/2004, 05/13/2008    Health Maintenance  Topic Date Due   Hepatitis B Vaccines 19-59 Average Risk (3 of 3 - 3-dose series) 06/13/2004   HPV VACCINES (1 - 3-dose series) Never done   HIV Screening  Never done   Meningococcal B Vaccine (1 of 2 - Standard) Never done   Hepatitis C Screening  Never done   Cervical Cancer Screening (Pap smear)  Never done   Influenza Vaccine  12/15/2024 (Originally  04/17/2024)   COVID-19 Vaccine (1 - 2025-26 season) 05/17/2025 (Originally 05/18/2024)   DTaP/Tdap/Td (7 - Td or Tdap) 03/04/2034   Pneumococcal Vaccine  Aged Out    Discussed health benefits of physical activity, and encouraged her to engage in regular exercise appropriate for her age and condition.  Problem List Items Addressed This Visit   None Visit Diagnoses       Routine general medical examination at a health care facility    -  Primary   Relevant Orders   CBC with Differential   Comprehensive metabolic panel   Lipid panel   TSH   HIV antibody   Hepatitis C antibody screen      Return in 1 year (on 09/25/2025).     Shamone Winzer B Andrea Colglazier, FNP       [1] Outpatient Medications Prior to Visit  Medication Sig   amitriptyline (ELAVIL) 25 MG tablet Take 25 mg by mouth at bedtime.   ARIPiprazole  (ABILIFY ) 5 MG tablet Take 1 tablet (5 mg total) by mouth at bedtime.   cyclobenzaprine  (FLEXERIL ) 5 MG tablet Take 1 tablet (5 mg total) by mouth 3 (three) times daily as needed for muscle spasms.   FLUoxetine (PROZAC) 40 MG capsule Take 40 mg by mouth every morning.   lidocaine -prilocaine  (EMLA ) cream Apply 1 Application topically as needed. Apply cream to skin to assist with pain   Multiple Vitamin (MULTI-VITAMIN) tablet Take 1 tablet by mouth daily.   Vitamin D , Ergocalciferol , (DRISDOL ) 1.25 MG (50000 UNIT) CAPS capsule Take 1 capsule (50,000 Units total) by mouth every 7 (seven) days.   phenazopyridine  (PYRIDIUM ) 200 MG tablet Take 1 tablet (200 mg total) by mouth 3 (three) times daily as needed for pain. (Patient not taking: Reported on 09/25/2024)   topiramate (TOPAMAX) 25 MG tablet Take 25 mg by mouth at bedtime. (Patient not taking: Reported on 09/25/2024)   No facility-administered medications prior to visit.  "

## 2024-09-25 NOTE — Progress Notes (Signed)
"..  PPD Reading Note  PPD read and results entered in EpicCare.  Result: 0 mm induration.  Interpretation: negative  If test not read within 48-72 hours of initial placement, patient advised to repeat in other arm 1-3 weeks after this test.  Allergic reaction: no    "

## 2024-09-26 ENCOUNTER — Ambulatory Visit: Payer: Self-pay | Admitting: Family

## 2024-09-27 LAB — HIV ANTIBODY (ROUTINE TESTING W REFLEX)
HIV 1&2 Ab, 4th Generation: NONREACTIVE
HIV FINAL INTERPRETATION: NEGATIVE

## 2024-09-27 LAB — HEPATITIS C ANTIBODY: Hepatitis C Ab: NONREACTIVE

## 2024-09-28 ENCOUNTER — Other Ambulatory Visit: Payer: Self-pay | Admitting: Family

## 2024-09-28 DIAGNOSIS — E162 Hypoglycemia, unspecified: Secondary | ICD-10-CM

## 2024-09-28 NOTE — Progress Notes (Signed)
 Patient left prior to in person visit due to time constraints. She endorses good results with current treatments, only concern is regarding some easy bruising recently and wondering if medications may be contributing to that. She denies bloody stool or excessive bleeding. Advised that some medications may reduce productions of RBCs, but this is rare. Ordered CBC for evaluation, but otherwise we are not managing any controlled substances so she can continue to follow with her PCP and with our office PRN.    Norma JAYSON Likes, DO 09/28/2024

## 2024-09-29 ENCOUNTER — Ambulatory Visit: Admitting: Radiology

## 2024-09-29 ENCOUNTER — Other Ambulatory Visit

## 2024-09-29 DIAGNOSIS — E162 Hypoglycemia, unspecified: Secondary | ICD-10-CM

## 2024-09-29 LAB — HEMOGLOBIN A1C: Hgb A1c MFr Bld: 5.1 % (ref 4.6–6.5)

## 2024-09-30 ENCOUNTER — Ambulatory Visit: Payer: Self-pay | Admitting: Family

## 2024-09-30 ENCOUNTER — Telehealth: Payer: Self-pay

## 2024-09-30 NOTE — Telephone Encounter (Signed)
 I called and spoke with patient and she is wanting to decrease Abilify  5mg . She said since stating this medication she has experience dizziness and shaking.

## 2024-09-30 NOTE — Telephone Encounter (Signed)
 Copied from CRM [id number]. Topic: Clinical - Lab/Test Results >> Sep 30, 2024  9:51 AM Vena HERO wrote: Reason for CRM: Pt called in requesting a nurse call back to discuss lab results

## 2024-09-30 NOTE — Telephone Encounter (Signed)
 I tried to call patient and no answer and voicemail has not been set up.

## 2024-10-02 ENCOUNTER — Ambulatory Visit
Admission: EM | Admit: 2024-10-02 | Discharge: 2024-10-02 | Disposition: A | Discharge status: Home / Self Care | Attending: Family Medicine | Admitting: Family Medicine

## 2024-10-02 DIAGNOSIS — R350 Frequency of micturition: Secondary | ICD-10-CM | POA: Diagnosis present

## 2024-10-02 DIAGNOSIS — N898 Other specified noninflammatory disorders of vagina: Secondary | ICD-10-CM | POA: Diagnosis not present

## 2024-10-02 DIAGNOSIS — Z113 Encounter for screening for infections with a predominantly sexual mode of transmission: Secondary | ICD-10-CM | POA: Diagnosis not present

## 2024-10-02 DIAGNOSIS — Z7251 High risk heterosexual behavior: Secondary | ICD-10-CM

## 2024-10-02 LAB — POCT URINE DIPSTICK
Bilirubin, UA: NEGATIVE
Blood, UA: NEGATIVE
Glucose, UA: NEGATIVE mg/dL
Leukocytes, UA: NEGATIVE
Nitrite, UA: NEGATIVE
POC PROTEIN,UA: NEGATIVE
Spec Grav, UA: 1.02
Urobilinogen, UA: 1 U/dL
pH, UA: 7

## 2024-10-02 LAB — POCT URINE PREGNANCY: Preg Test, Ur: NEGATIVE

## 2024-10-02 NOTE — Discharge Instructions (Addendum)
 Make sure you hydrate very well with plain water and a quantity of 80 ounces of water a day.  Please limit drinks that are considered urinary irritants such as fruit juices, soda, sweet tea, coffee, artifical sweetened drinks, energy drinks, alcohol.  These can worsen your urinary and genital symptoms but also be the source of them.  Our results team will let you know about your test results in the next 2 to 3 days.  Will treat any infections that you test positive for based off of those results.

## 2024-10-02 NOTE — Telephone Encounter (Signed)
 I called and spoke with patient and notified her of below message and she will call psychiatrist.

## 2024-10-02 NOTE — ED Triage Notes (Signed)
 Pt requesting STD testing and preg test-LMP 09/17/2024-NAD-steady gait

## 2024-10-02 NOTE — ED Provider Notes (Signed)
 " [facility]  Note:  This document was prepared using Conservation officer, historic buildings and may include unintentional dictation errors.  MRN: [id number] DOB: Apr 11, 2001  Subjective:   Norma Wright is a 24 y.o. female presenting for 3 day history of yellow vaginal discharge, dark urine.  LMP was 09/17/2024. Had concerns that the last time she had sex with her ex, the condom slipped off. Wants to be tested for pregnancy. Denies fever, n/v, abdominal pain, pelvic pain, rashes, dysuria, urinary frequency, hematuria. Hydrates with 48 ounces of water daily sometimes 64 ounces. Drinks diet soda ~4 times weekly. Has a history of interstitial cystitis. Has about 16 ounces of coffee daily.  On 09/25/2024, patient had HIV and hepatitis C antibody testing both of which were negative.   Current Outpatient Medications  Medication Instructions   amitriptyline (ELAVIL) 25 mg, Daily at bedtime   ARIPiprazole  (ABILIFY ) 5 mg, Oral, Daily at bedtime   cyclobenzaprine  (FLEXERIL ) 5 mg, Oral, 3 times daily PRN   FLUoxetine (PROZAC) 40 mg, Every morning   lidocaine -prilocaine  (EMLA ) cream 1 Application, Topical, As needed, Apply cream to skin to assist with pain   Multiple Vitamin (MULTI-VITAMIN) tablet 1 tablet, Daily   phenazopyridine  (PYRIDIUM ) 200 mg, Oral, 3 times daily PRN   topiramate (TOPAMAX) 25 mg, Daily at bedtime   Vitamin D  (Ergocalciferol ) (DRISDOL ) 50,000 Units, Oral, Every 7 days    Allergies[1]  Past Medical History:  Diagnosis Date   Anemia    Fibromyalgia    Heart murmur    Interstitial cystitis    PID (pelvic inflammatory disease)      History reviewed. No pertinent surgical history.  Family History  Problem Relation Age of Onset   Heart Problems Mother    Congestive Heart Failure Mother    Asthma Sister    Kidney disease Maternal Grandfather    Asthma Maternal Uncle    Bladder Cancer Neg Hx    Uterine cancer Neg Hx    Migraines Neg Hx     Social  History   Occupational History   Not on file  Tobacco Use   Smoking status: Never    Passive exposure: Never   Smokeless tobacco: Never  Vaping Use   Vaping status: Never Used  Substance and Sexual Activity   Alcohol use: No   Drug use: Never   Sexual activity: Yes    Partners: Male    Birth control/protection: None     ROS   Objective:   Vitals: BP 124/75 (BP Location: Left Arm)   Pulse 86   Temp 98.3 F (36.8 C) (Oral)   Resp 18   LMP 09/17/2024   SpO2 98%   Physical Exam Constitutional:      General: She is not in acute distress.    Appearance: Normal appearance. She is well-developed. She is not ill-appearing, toxic-appearing or diaphoretic.  HENT:     Head: Normocephalic and atraumatic.     Nose: Nose normal.     Mouth/Throat:     Mouth: Mucous membranes are moist.  Eyes:     General: No scleral icterus.       Right eye: No discharge.        Left eye: No discharge.     Extraocular Movements: Extraocular movements intact.     Conjunctiva/sclera: Conjunctivae normal.  Cardiovascular:     Rate and Rhythm: Normal rate.  Pulmonary:     Effort: Pulmonary effort is normal.  Abdominal:  General: Bowel sounds are normal. There is no distension.     Palpations: Abdomen is soft. There is no mass.     Tenderness: There is no abdominal tenderness. There is no right CVA tenderness, left CVA tenderness, guarding or rebound.  Skin:    General: Skin is warm and dry.  Neurological:     General: No focal deficit present.     Mental Status: She is alert and oriented to person, place, and time.  Psychiatric:        Mood and Affect: Mood normal.        Behavior: Behavior normal.        Thought Content: Thought content normal.        Judgment: Judgment normal.     Results for orders placed or performed during the hospital encounter of 10/02/24 (from the past 24 hours)  POCT urine pregnancy     Status: None   Collection Time: 10/02/24  5:36 PM  Result Value Ref  Range   Preg Test, Ur Negative Negative  POCT URINE DIPSTICK     Status: Abnormal   Collection Time: 10/02/24  5:36 PM  Result Value Ref Range   Color, UA yellow yellow   Clarity, UA clear clear   Glucose, UA negative negative mg/dL   Bilirubin, UA negative negative   Ketones, POC UA trace (5) (A) negative mg/dL   Spec Grav, UA 8.979 8.989 - 1.025   Blood, UA negative negative   pH, UA 7.0 5.0 - 8.0   POC PROTEIN,UA negative negative, trace   Urobilinogen, UA 1.0 0.2 or 1.0 E.U./dL   Nitrite, UA Negative Negative   Leukocytes, UA Negative Negative    Assessment and Plan :   PDMP not reviewed this encounter.  1. Vaginal discharge   2. Screening examination for STI   3. Unprotected sex   4. Urinary frequency      Patient was agreeable to treat based off of results.  However if she continues to have symptoms of significant vaginal discharge, I offered to treat for bacterial vaginosis over the weekend.  Patient will consider this if her symptoms worsen.      [1]  Allergies Allergen Reactions   Shellfish Allergy       Christopher Savannah, PA-C 10/02/24 1752  "

## 2024-10-03 LAB — SYPHILIS: RPR W/REFLEX TO RPR TITER AND TREPONEMAL ANTIBODIES, TRADITIONAL SCREENING AND DIAGNOSIS ALGORITHM: RPR Ser Ql: NONREACTIVE

## 2024-10-05 ENCOUNTER — Ambulatory Visit (HOSPITAL_COMMUNITY): Payer: Self-pay

## 2024-10-05 LAB — CERVICOVAGINAL ANCILLARY ONLY
Bacterial Vaginitis (gardnerella): POSITIVE — AB
Candida Glabrata: NEGATIVE
Candida Vaginitis: NEGATIVE
Chlamydia: NEGATIVE
Comment: NEGATIVE
Comment: NEGATIVE
Comment: NEGATIVE
Comment: NEGATIVE
Comment: NEGATIVE
Comment: NORMAL
Neisseria Gonorrhea: NEGATIVE
Trichomonas: NEGATIVE

## 2024-10-05 MED ORDER — METRONIDAZOLE 500 MG PO TABS: 500.0000 mg | ORAL_TABLET | Freq: Two times a day (BID) | ORAL | 0 refills | Status: AC

## 2024-10-06 ENCOUNTER — Encounter: Payer: Self-pay | Admitting: Obstetrics and Gynecology

## 2024-10-20 ENCOUNTER — Encounter: Admitting: Nurse Practitioner

## 2024-10-23 ENCOUNTER — Telehealth (HOSPITAL_COMMUNITY): Payer: Self-pay | Admitting: Licensed Clinical Social Worker

## 2024-10-23 NOTE — Telephone Encounter (Signed)
"  See call log  "

## 2024-11-04 ENCOUNTER — Ambulatory Visit: Admitting: Radiology
